# Patient Record
Sex: Female | Born: 1940 | Race: White | Hispanic: No | Smoking: Never smoker
Health system: Southern US, Community
[De-identification: ages and names within clinical notes are randomized; demographics above are authoritative.]

## PROBLEM LIST (undated history)

## (undated) DIAGNOSIS — D219 Benign neoplasm of connective and other soft tissue, unspecified: Secondary | ICD-10-CM

## (undated) DIAGNOSIS — H9190 Unspecified hearing loss, unspecified ear: Secondary | ICD-10-CM

## (undated) DIAGNOSIS — G43909 Migraine, unspecified, not intractable, without status migrainosus: Secondary | ICD-10-CM

## (undated) DIAGNOSIS — T8859XA Other complications of anesthesia, initial encounter: Secondary | ICD-10-CM

## (undated) DIAGNOSIS — R413 Other amnesia: Secondary | ICD-10-CM

## (undated) DIAGNOSIS — M1610 Unilateral primary osteoarthritis, unspecified hip: Secondary | ICD-10-CM

## (undated) DIAGNOSIS — M419 Scoliosis, unspecified: Secondary | ICD-10-CM

## (undated) DIAGNOSIS — N809 Endometriosis, unspecified: Secondary | ICD-10-CM

## (undated) DIAGNOSIS — E7800 Pure hypercholesterolemia, unspecified: Secondary | ICD-10-CM

## (undated) DIAGNOSIS — E079 Disorder of thyroid, unspecified: Secondary | ICD-10-CM

## (undated) HISTORY — DX: Migraine, unspecified, not intractable, without status migrainosus: G43.909

## (undated) HISTORY — DX: Unilateral primary osteoarthritis, unspecified hip: M16.10

## (undated) HISTORY — DX: Benign neoplasm of connective and other soft tissue, unspecified: D21.9

## (undated) HISTORY — DX: Pure hypercholesterolemia, unspecified: E78.00

## (undated) HISTORY — DX: Other amnesia: R41.3

## (undated) HISTORY — DX: Scoliosis, unspecified: M41.9

## (undated) HISTORY — DX: Endometriosis, unspecified: N80.9

## (undated) HISTORY — DX: Unspecified hearing loss, unspecified ear: H91.90

## (undated) HISTORY — DX: Disorder of thyroid, unspecified: E07.9

---

## 1965-01-19 HISTORY — PX: OOPHORECTOMY: SHX86

## 1979-01-20 HISTORY — PX: TOTAL ABDOMINAL HYSTERECTOMY: SHX209

## 1997-08-02 ENCOUNTER — Ambulatory Visit (HOSPITAL_COMMUNITY): Admission: RE | Admit: 1997-08-02 | Discharge: 1997-08-02 | Payer: Self-pay | Admitting: *Deleted

## 1998-12-04 ENCOUNTER — Other Ambulatory Visit: Admission: RE | Admit: 1998-12-04 | Discharge: 1998-12-04 | Payer: Self-pay | Admitting: Obstetrics and Gynecology

## 1999-01-20 DIAGNOSIS — R413 Other amnesia: Secondary | ICD-10-CM

## 1999-01-20 HISTORY — DX: Other amnesia: R41.3

## 1999-04-30 ENCOUNTER — Encounter: Admission: RE | Admit: 1999-04-30 | Discharge: 1999-04-30 | Payer: Self-pay | Admitting: Cardiology

## 1999-04-30 ENCOUNTER — Encounter: Payer: Self-pay | Admitting: Cardiology

## 2000-05-07 ENCOUNTER — Encounter: Admission: RE | Admit: 2000-05-07 | Discharge: 2000-05-07 | Payer: Self-pay | Admitting: Cardiology

## 2000-05-07 ENCOUNTER — Encounter: Payer: Self-pay | Admitting: Cardiology

## 2001-01-04 ENCOUNTER — Ambulatory Visit (HOSPITAL_COMMUNITY): Admission: RE | Admit: 2001-01-04 | Discharge: 2001-01-04 | Payer: Self-pay | Admitting: *Deleted

## 2001-03-11 ENCOUNTER — Encounter: Payer: Self-pay | Admitting: Obstetrics and Gynecology

## 2001-03-11 ENCOUNTER — Encounter: Admission: RE | Admit: 2001-03-11 | Discharge: 2001-03-11 | Payer: Self-pay | Admitting: Family Medicine

## 2001-05-12 ENCOUNTER — Encounter: Payer: Self-pay | Admitting: Obstetrics and Gynecology

## 2001-05-12 ENCOUNTER — Encounter: Admission: RE | Admit: 2001-05-12 | Discharge: 2001-05-12 | Payer: Self-pay | Admitting: Obstetrics and Gynecology

## 2002-08-18 ENCOUNTER — Ambulatory Visit (HOSPITAL_COMMUNITY): Admission: RE | Admit: 2002-08-18 | Discharge: 2002-08-18 | Payer: Self-pay | Admitting: Cardiology

## 2002-08-18 ENCOUNTER — Encounter: Payer: Self-pay | Admitting: Cardiology

## 2003-01-01 ENCOUNTER — Ambulatory Visit (HOSPITAL_COMMUNITY): Admission: RE | Admit: 2003-01-01 | Discharge: 2003-01-01 | Payer: Self-pay | Admitting: Surgery

## 2003-01-01 ENCOUNTER — Encounter (INDEPENDENT_AMBULATORY_CARE_PROVIDER_SITE_OTHER): Payer: Self-pay | Admitting: Specialist

## 2003-03-17 ENCOUNTER — Encounter: Admission: RE | Admit: 2003-03-17 | Discharge: 2003-03-17 | Payer: Self-pay | Admitting: Internal Medicine

## 2003-04-06 ENCOUNTER — Encounter: Admission: RE | Admit: 2003-04-06 | Discharge: 2003-04-06 | Payer: Self-pay | Admitting: Cardiology

## 2003-06-25 ENCOUNTER — Ambulatory Visit (HOSPITAL_COMMUNITY): Admission: RE | Admit: 2003-06-25 | Discharge: 2003-06-25 | Payer: Self-pay | Admitting: Surgery

## 2004-04-10 ENCOUNTER — Encounter: Admission: RE | Admit: 2004-04-10 | Discharge: 2004-04-10 | Payer: Self-pay | Admitting: Cardiology

## 2004-05-29 ENCOUNTER — Encounter: Admission: RE | Admit: 2004-05-29 | Discharge: 2004-05-29 | Payer: Self-pay | Admitting: Obstetrics and Gynecology

## 2004-09-05 ENCOUNTER — Ambulatory Visit (HOSPITAL_COMMUNITY): Admission: RE | Admit: 2004-09-05 | Discharge: 2004-09-05 | Payer: Self-pay | Admitting: Cardiology

## 2005-04-23 ENCOUNTER — Encounter: Admission: RE | Admit: 2005-04-23 | Discharge: 2005-04-23 | Payer: Self-pay | Admitting: Obstetrics and Gynecology

## 2006-04-06 ENCOUNTER — Ambulatory Visit (HOSPITAL_COMMUNITY): Admission: RE | Admit: 2006-04-06 | Discharge: 2006-04-06 | Payer: Self-pay | Admitting: *Deleted

## 2006-05-31 ENCOUNTER — Encounter: Admission: RE | Admit: 2006-05-31 | Discharge: 2006-05-31 | Payer: Self-pay | Admitting: Obstetrics and Gynecology

## 2006-06-02 ENCOUNTER — Encounter: Admission: RE | Admit: 2006-06-02 | Discharge: 2006-06-02 | Payer: Self-pay | Admitting: Obstetrics and Gynecology

## 2007-06-08 ENCOUNTER — Ambulatory Visit (HOSPITAL_COMMUNITY): Admission: RE | Admit: 2007-06-08 | Discharge: 2007-06-08 | Payer: Self-pay | Admitting: *Deleted

## 2007-06-14 ENCOUNTER — Encounter: Admission: RE | Admit: 2007-06-14 | Discharge: 2007-06-14 | Payer: Self-pay | Admitting: Cardiology

## 2007-06-17 ENCOUNTER — Encounter: Admission: RE | Admit: 2007-06-17 | Discharge: 2007-06-17 | Payer: Self-pay | Admitting: Cardiology

## 2008-06-19 ENCOUNTER — Encounter: Admission: RE | Admit: 2008-06-19 | Discharge: 2008-06-19 | Payer: Self-pay | Admitting: Obstetrics and Gynecology

## 2009-07-11 ENCOUNTER — Encounter: Admission: RE | Admit: 2009-07-11 | Discharge: 2009-07-11 | Payer: Self-pay | Admitting: Anesthesiology

## 2010-02-09 ENCOUNTER — Encounter: Payer: Self-pay | Admitting: Cardiology

## 2010-06-03 NOTE — Op Note (Signed)
[name], Cheryl Bullock NO.:  [id number]   MEDICAL RECORD NO.:  [id number]          PATIENT TYPE:  AMB   LOCATION:  ENDO                         FACILITY:  [facility]   PHYSICIAN:  Georgiana Spinner, M.D.    DATE OF BIRTH:  [birth year]   DATE OF PROCEDURE:  06/08/2007  DATE OF DISCHARGE:                               OPERATIVE REPORT   PROCEDURE:  Colonoscopy.   INDICATIONS:  Abdominal pain.   ANESTHESIA:  Fentanyl 100 mcg, Versed 10 mg.   PROCEDURE:  With the patient mildly sedated in the left lateral  decubitus position, the Pentax videoscopic pediatric colonoscope was  inserted in the rectum, passed under direct vision through a very  tortuous colon in the sigmoid area. It took [place] quite some time to do  this but we were able to pass through this and get to what we felt was  possibly the splenic flexure and at that point further pressure on the  scope caused discomfort and my intention with this was to just get to  this level so I could review the sigmoid colon, so from this point the  colonoscope was slowly withdrawn taking circumferential views of the  colonic mucosa, looking very well at the sigmoid colon where no  abnormalities were noted but there were probably adhesions because there  was a tight turn that was difficult to traverse but we were able to  subsequently do this and withdrew the colonoscope taking circumferential  views of the remaining colonic mucosa stopping in the rectum, which  appeared normal on direct and showed hemorrhoids on retroflexed view.  The endoscope was straightened and withdrawn.  The patient's vital signs  and pulse oximetry remained stable.  The patient tolerated the procedure  well without apparent complications.   FINDINGS:  1. Tortuous sigmoid colon.  2. Presumably adhesions.  3. Otherwise an unremarkable examination but limited to pretty much      this area.           ______________________________  Georgiana Spinner,  M.D.     GMO/MEDQ  D:  06/08/2007  T:  06/08/2007  Job:  [id number]

## 2010-06-06 NOTE — Op Note (Signed)
Cheryl Bullock, YINGST NO.:  [id number]   MEDICAL RECORD NO.:  [id number]          PATIENT TYPE:  AMB   LOCATION:  ENDO                         FACILITY:  [facility]   PHYSICIAN:  Georgiana Spinner, M.D.    DATE OF BIRTH:  Sep 19, 1940   DATE OF PROCEDURE:  DATE OF DISCHARGE:                               OPERATIVE REPORT   ANESTHESIA:  Demerol 60 mg, Versed 5 mg.   PROCEDURE:  With the patient mildly sedated in the left lateral  decubitus position a Pentax videoscopic endoscope was inserted in the  mouth and passed under direct vision through the esophagus, which  appeared normal to the stomach.  The fundus, body, antrum, duodenal  bulb, second portion was visualized.  From this point, the endoscope was  slowly withdrawn taking circumferential views of the duodenal mucosa  until the endoscope had been pulled back into the stomach, placed in  retroflexion and viewed the stomach from below.  The endoscope was then  straightened and withdrawn, taking circumferential views of the  remaining gastric and esophageal mucosa.  The patient's vital signs and  pulses continued to remain stable.  The patient tolerated the procedure  well.  No apparent complications.           ______________________________  Georgiana Spinner, M.D.     GMO/MEDQ  D:  04/06/2006  T:  04/06/2006  Job:  [id number]

## 2010-06-06 NOTE — Op Note (Signed)
NAMEJAHNAY, LANTIER NO.:  [id number]   MEDICAL RECORD NO.:  [id number]          PATIENT TYPE:  AMB   LOCATION:  ENDO                         FACILITY:  [facility]   PHYSICIAN:  Georgiana Spinner, M.D.    DATE OF BIRTH:  Jan 07, 1941   DATE OF PROCEDURE:  04/06/2006  DATE OF DISCHARGE:  04/06/2006                               OPERATIVE REPORT   PROCEDURE:  Colonoscopy.   INDICATIONS:  Colon cancer screening, colon polyps.   ANESTHESIA:  Demerol 20 mg, Versed 3 mg.   DESCRIPTION OF PROCEDURE:  With the patient mildly sedated in the left  lateral decubitus position, the Pentax videoscopic colonoscope was  inserted in the rectum, passed under direct vision to the sigmoid colon,  and could be advanced no further due to an acute turn in the colon.  Therefore, the colonoscope was withdrawn.  Subsequently, the Pentax  videoscopic pediatric colonoscope was inserted in the rectum and then  passed under direct vision to the cecum identified by the ileocecal  valve and appendiceal orifice, both of which were photographed. From  this point, the colonoscope was slowly withdrawn taking circumferential  views of the colonic mucosa stopping in the rectum which appeared normal  on direct and showed hemorrhoids on retroflexed view. The endoscope was  straightened and withdrawn.  The patient's vital signs and pulse  oximeter remained stable.  The patient tolerated the procedure well  without apparent complication.   FINDINGS:  Tortuosity of sigmoid colon.  Internal hemorrhoids.  Otherwise, an unremarkable examination.   PLAN:  Repeat examination possibly in one year to view the area that  could not be well seen due to the tortuosity of the colon and this was  explained to the patient at this time.           ______________________________  Georgiana Spinner, M.D.     GMO/MEDQ  D:  04/08/2006  T:  04/08/2006  Job:  [id number]

## 2010-06-06 NOTE — Procedures (Signed)
[facility]  Patient:    Cheryl Bullock, Cheryl Bullock Visit Number: [id number] MRN: [id number]          Service Type: END Location: ENDO Attending Physician:  Sabino Gasser Dictated by:   Sabino Gasser, M.D. Admit Date:  01/04/2001                             Procedure Report  PROCEDURE:  Colonoscopy.  INDICATION:  Colon polyp.  ANESTHESIA:  Demerol 50 mg, Versed 7.5 mg.  DESCRIPTION OF PROCEDURE:  With patient mildly sedated in the left lateral decubitus position, the Olympus videoscopic colonoscope was inserted in the rectum, passed under direct vision to the cecum, identified by ileocecal valve and appendiceal orifice, both of which were photographed.  From this point the colonoscope was slowly withdrawn, taking circumferential views of the entire colonic mucosa, stopping only in the rectum, which appeared normal on direct and showed hemorrhoids on retroflex view.  The endoscope was straightened and withdrawn.  The patients vital signs and pulse oximetry remained stable.  The patient tolerated the procedure well without apparent complications.  FINDINGS:  Unremarkable colonoscopic examination other than hemorrhoids.  PLAN:  Repeat examination in five years. Dictated by:   Sabino Gasser, M.D. Attending Physician:  Sabino Gasser DD:  01/04/01 TD:  01/04/01 Job: [id number]

## 2010-06-06 NOTE — Op Note (Signed)
[name], Cheryl Bullock NO.:  [id number]   MEDICAL RECORD NO.:  [id number]          PATIENT TYPE:  AMB   LOCATION:  ENDO                         FACILITY:  [facility]   PHYSICIAN:  Georgiana Spinner, M.D.    DATE OF BIRTH:  [birth year]   DATE OF PROCEDURE:  DATE OF DISCHARGE:                               OPERATIVE REPORT   PROCEDURE:  __________   ANESTHESIA:  __________   PROCEDURE:  With the patient mildly sedated in the left lateral  decubitus position,__________ was inserted but could not be entered  further.  Therefore, __________ photographed.  From this point, the  colonoscope was slowly withdrawn __________ .  The patient tolerated the  procedure well. __________           ______________________________  Georgiana Spinner, M.D.     GMO/MEDQ  D:  04/06/2006  T:  04/06/2006  Job:  [id number]

## 2010-06-11 ENCOUNTER — Other Ambulatory Visit: Payer: Self-pay | Admitting: Internal Medicine

## 2010-06-11 DIAGNOSIS — Z1231 Encounter for screening mammogram for malignant neoplasm of breast: Secondary | ICD-10-CM

## 2010-07-14 ENCOUNTER — Ambulatory Visit
Admission: RE | Admit: 2010-07-14 | Discharge: 2010-07-14 | Disposition: A | Discharge status: Home / Self Care | Payer: Medicare Other | Source: Ambulatory Visit | Attending: Internal Medicine | Admitting: Internal Medicine

## 2010-07-14 DIAGNOSIS — Z1231 Encounter for screening mammogram for malignant neoplasm of breast: Secondary | ICD-10-CM

## 2011-06-09 ENCOUNTER — Other Ambulatory Visit: Payer: Self-pay | Admitting: Obstetrics and Gynecology

## 2011-06-09 DIAGNOSIS — Z1231 Encounter for screening mammogram for malignant neoplasm of breast: Secondary | ICD-10-CM

## 2011-07-16 ENCOUNTER — Ambulatory Visit
Admission: RE | Admit: 2011-07-16 | Discharge: 2011-07-16 | Disposition: A | Discharge status: Home / Self Care | Payer: Medicare Other | Source: Ambulatory Visit | Attending: Obstetrics and Gynecology | Admitting: Obstetrics and Gynecology

## 2011-07-16 DIAGNOSIS — Z1231 Encounter for screening mammogram for malignant neoplasm of breast: Secondary | ICD-10-CM

## 2012-06-06 ENCOUNTER — Other Ambulatory Visit: Payer: Self-pay

## 2012-06-06 DIAGNOSIS — Z1231 Encounter for screening mammogram for malignant neoplasm of breast: Secondary | ICD-10-CM

## 2012-07-18 ENCOUNTER — Ambulatory Visit
Admission: RE | Admit: 2012-07-18 | Discharge: 2012-07-18 | Disposition: A | Discharge status: Home / Self Care | Payer: Medicare Other | Source: Ambulatory Visit

## 2012-07-18 DIAGNOSIS — Z1231 Encounter for screening mammogram for malignant neoplasm of breast: Secondary | ICD-10-CM

## 2012-08-03 ENCOUNTER — Encounter: Payer: Self-pay | Admitting: Obstetrics and Gynecology

## 2012-08-09 ENCOUNTER — Encounter: Payer: Self-pay | Admitting: Obstetrics and Gynecology

## 2012-08-09 ENCOUNTER — Ambulatory Visit (INDEPENDENT_AMBULATORY_CARE_PROVIDER_SITE_OTHER): Payer: Medicare Other | Admitting: Obstetrics and Gynecology

## 2012-08-09 VITALS — BP 98/60 | HR 76 | Resp 16 | Ht 64.5 in | Wt 133.0 lb

## 2012-08-09 DIAGNOSIS — Z01419 Encounter for gynecological examination (general) (routine) without abnormal findings: Secondary | ICD-10-CM

## 2012-08-09 MED ORDER — ESTROPIPATE 0.75 MG PO TABS: 0.7500 mg | ORAL_TABLET | Freq: Every day | ORAL | Status: DC

## 2012-08-09 NOTE — Progress Notes (Signed)
72 y.o.   Married    Caucasian   female   No obstetric history on file.   here for annual exam.  Still wants to take her ERT   No LMP recorded. Patient has had a hysterectomy.          Sexually active: yes  The current method of family planning is status post hysterectomy and post menopausal status.    Exercising: [employer] 4-5 days a week, weights, cardio, stretching Last mammogram:  07/2012 normal Last pap smear:12/04/1998 neg History of abnormal pap: no Smoking: never Alcohol: 1-2 glasses of alcohol a week (vodka) Last colonoscopy:2009 normal, repeat in 10 years Last Bone Density:  04/15/09 ? osteopenia Last tetanus shot: 2005 Last cholesterol check: 2013 normal with medication  Hgb:   pcp             Urine: pcp   Family History  Problem Relation Age of Onset  . Cancer Mother     pancreatic cancer  . Osteoporosis Mother   . Dementia Father   . Cancer Maternal Aunt     pancreatic cancer    There are no active problems to display for this patient.   Past Medical History  Diagnosis Date  . Thyroid disease     hypo  . Migraines   . Arthritis, hip   . Hard of hearing     hearing aids  . Elevated cholesterol   . Endometriosis   . Fibroid   . 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 Total abdominal hysterectomy  1981    bilat oophorectomy    Allergies: Biaxin  Current Outpatient Prescriptions  Medication Sig Dispense Refill  . aspirin 81 MG tablet Take 81 mg by mouth daily.      . Calcium Carb-Cholecalciferol (CALCIUM PLUS VITAMIN D3) 600-500 MG-UNIT CAPS Take by mouth 2 (two) times daily.      . Estropipate (OGEN 0.625 PO) Take by mouth daily.      Marland Kitchen levothyroxine (SYNTHROID, LEVOTHROID) 88 MCG tablet Take 88 mcg by mouth daily before breakfast.      . omeprazole (PRILOSEC) 20 MG capsule       . ranitidine (ZANTAC) 150 MG capsule Take 150 mg by mouth daily.      . rizatriptan (MAXALT) 10 MG tablet Take 10  mg by mouth as needed for migraine. May repeat in 2 hours if needed      . simvastatin (ZOCOR) 5 MG tablet        No current facility-administered medications for this visit.    ROS: Pertinent items are noted in HPI.  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  Exam:    BP 98/60  Pulse 76  Resp 16  Ht 5' 4.5" (1.638 m)  Wt 133 lb (60.328 kg)  BMI 22.48 kg/m2  Ht and wt stable from last year Wt Readings from Last 3 Encounters:  08/09/12 133 lb (60.328 kg)     Ht Readings from Last 3 Encounters:  08/09/12 5' 4.5" (1.638 m)    General appearance: alert, cooperative and appears stated age Head: Normocephalic, without obvious abnormality, atraumatic Neck: no adenopathy, supple, symmetrical, trachea midline and thyroid not enlarged, symmetric, no tenderness/mass/nodules Lungs: clear to auscultation bilaterally Breasts: Inspection negative, No nipple retraction or dimpling, No nipple discharge or bleeding, No axillary or supraclavicular adenopathy, Normal  to palpation without dominant masses Heart: regular rate and rhythm Abdomen: soft, non-tender; bowel sounds normal; no masses,  no organomegaly Extremities: extremities normal, atraumatic, no cyanosis or edema Skin: Skin color, texture, turgor normal. No rashes or lesions Lymph nodes: Cervical, supraclavicular, and axillary nodes normal. No abnormal inguinal nodes palpated Neurologic: Grossly normal   Pelvic: External genitalia:  no lesions              Urethra:  normal appearing urethra with no masses, tenderness or lesions              Bartholins and Skenes: normal                 Vagina: normal appearing vagina with normal color and discharge, no lesions              Cervix: absent              Pap taken: no        Bimanual Exam:  Uterus:  absent                                      Adnexa: absent, nontender and no masses                                       Rectovaginal: Confirms                                      Anus:  normal sphincter tone, no lesions  A: normal menopausal exam, on ERT     S/p TAH/BSO fibroids     Hard of hearing - wears hearing aids     P: mammogram counseled on breast self exam, mammography screening, adequate intake of calcium and vitamin D, diet and exercise return annually or prn     An After Visit Summary was printed and given to the patient.

## 2012-08-09 NOTE — Patient Instructions (Signed)

## 2013-05-06 ENCOUNTER — Other Ambulatory Visit: Payer: Self-pay | Admitting: Nurse Practitioner

## 2013-06-16 ENCOUNTER — Other Ambulatory Visit: Payer: Self-pay

## 2013-06-16 DIAGNOSIS — Z1231 Encounter for screening mammogram for malignant neoplasm of breast: Secondary | ICD-10-CM

## 2013-07-19 ENCOUNTER — Encounter (INDEPENDENT_AMBULATORY_CARE_PROVIDER_SITE_OTHER): Payer: Self-pay

## 2013-07-19 ENCOUNTER — Ambulatory Visit
Admission: RE | Admit: 2013-07-19 | Discharge: 2013-07-19 | Disposition: A | Discharge status: Home / Self Care | Payer: Medicare Other | Source: Ambulatory Visit

## 2013-07-19 DIAGNOSIS — Z1231 Encounter for screening mammogram for malignant neoplasm of breast: Secondary | ICD-10-CM

## 2013-08-10 ENCOUNTER — Ambulatory Visit: Payer: Medicare Other | Admitting: Obstetrics and Gynecology

## 2013-08-16 ENCOUNTER — Encounter: Payer: Self-pay | Admitting: Obstetrics and Gynecology

## 2013-08-16 ENCOUNTER — Ambulatory Visit (INDEPENDENT_AMBULATORY_CARE_PROVIDER_SITE_OTHER): Payer: Medicare Other | Admitting: Obstetrics and Gynecology

## 2013-08-16 VITALS — BP 108/70 | HR 68 | Ht 64.5 in | Wt 125.0 lb

## 2013-08-16 DIAGNOSIS — Z01419 Encounter for gynecological examination (general) (routine) without abnormal findings: Secondary | ICD-10-CM

## 2013-08-16 MED ORDER — ESTROPIPATE 0.75 MG PO TABS: 0.7500 mg | ORAL_TABLET | Freq: Every day | ORAL | Status: DC

## 2013-08-16 NOTE — Progress Notes (Signed)
GYNECOLOGY VISIT  PCP: Dr. Thressa Sheller  Referring provider:   HPI: 73 y.o.   Married  Caucasian  female   No obstetric history on file. with No LMP recorded. Patient has had a hysterectomy.   Had USO at age 20 and then had the other removed at time of hysterectomy at age 15 years old.  here for  Annual  Currently on Estropipate for menopausal symptoms.  Has not previously tried to come off.   Takes baby ASA daily for event of amnesia that lasted one hour.   Mother and sister with hx pancreatic cancer.  In a study for familial pancreatic cancer.  Not taking any Rx as part of the trial.   States cold intolerance and has normal thyroid levels on Synthroid.   Decreased libido.  Denies pain with intercourse.   Hgb: PCP 02/15  Urine: pcp   GYNECOLOGIC HISTORY: No LMP recorded. Patient has had a hysterectomy. Sexually active:  yes Partner preference: female Contraception:   none Menopausal hormone therapy: estropipate 1 tab qd DES exposure:   no Blood transfusions: no   Sexually transmitted diseases:   no GYN procedures and prior surgeries:  Total hysterectomy, 1981 Last mammogram: 07/20/13 BI-RADS1 Neg                Last pap and high risk HPV testing:12/04/1998 neg    History of abnormal pap smear:  no   OB History   Grav Para Term Preterm Abortions TAB SAB Ect Mult Living                   LIFESTYLE: Exercise:   Elliptical and weight bearing exercises            Tobacco: no Alcohol: 1 glass a week Drug use:no    OTHER HEALTH MAINTENANCE: Tetanus/TDap: 2005 will get with pcp Gardisil:no Influenza: 09/2012 with pcp  Zostavax: pcp  Bone density:2011, wnl Colonoscopy:2009, normal repeat in 10 years  Cholesterol check: pcp  Family History  Problem Relation Age of Onset  . Cancer Mother     pancreatic cancer  . Osteoporosis Mother   . Dementia Father   . Cancer Maternal Aunt     pancreatic cancer    There are no active problems to display for this  patient.  Past Medical History  Diagnosis Date  . Thyroid disease     hypo  . Migraines   . Arthritis, hip   . Hard of hearing     hearing aids  . Elevated cholesterol   . Endometriosis   . Fibroid   . 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 Total abdominal hysterectomy  1981    bilat oophorectomy    ALLERGIES: Biaxin  Current Outpatient Prescriptions  Medication Sig Dispense Refill  . acetaminophen (TYLENOL) 500 MG tablet Take 1,000 mg by mouth 2 (two) times daily.      Marland Kitchen aspirin 81 MG tablet Take 81 mg by mouth daily.      . Calcium Carb-Cholecalciferol (CALCIUM PLUS VITAMIN D3) 600-500 MG-UNIT CAPS Take by mouth 2 (two) times daily.      Marland Kitchen estropipate (OGEN) 0.75 MG tablet TAKE 1 TABLET BY MOUTH EVERY DAY  90 tablet  0  . levothyroxine (SYNTHROID, LEVOTHROID) 88 MCG tablet Take 88 mcg by mouth daily before breakfast.      . omeprazole (PRILOSEC) 20 MG capsule       . ranitidine (  ZANTAC) 150 MG capsule Take 150 mg by mouth daily.      . rizatriptan (MAXALT) 10 MG tablet Take 10 mg by mouth as needed for migraine. May repeat in 2 hours if needed      . simvastatin (ZOCOR) 5 MG tablet        No current facility-administered medications for this visit.     ROS:  Pertinent items are noted in HPI.  SOCIAL HISTORY:  REtired.   PHYSICAL EXAMINATION:    BP 108/70  Pulse 68  Ht 5' 4.5" (1.638 m)  Wt 125 lb (56.7 kg)  BMI 21.13 kg/m2   Wt Readings from Last 3 Encounters:  08/16/13 125 lb (56.7 kg)  08/09/12 133 lb (60.328 kg)     Ht Readings from Last 3 Encounters:  08/16/13 5' 4.5" (1.638 m)  08/09/12 5' 4.5" (1.638 m)    General appearance: alert, cooperative and appears stated age Head: Normocephalic, without obvious abnormality, atraumatic Neck: no adenopathy, supple, symmetrical, trachea midline and thyroid not enlarged, symmetric, no tenderness/mass/nodules Lungs: clear to auscultation bilaterally Breasts:  Inspection negative, No nipple retraction or dimpling, No nipple discharge or bleeding, No axillary or supraclavicular adenopathy, Normal to palpation without dominant masses Heart: regular rate and rhythm Abdomen: soft, non-tender; no masses,  no organomegaly Extremities: extremities normal, atraumatic, no cyanosis or edema Skin: Skin color, texture, turgor normal. No rashes or lesions Lymph nodes: Cervical, supraclavicular, and axillary nodes normal. No abnormal inguinal nodes palpated Neurologic: Grossly normal  Pelvic: External genitalia:  no lesions              Urethra:  normal appearing urethra with no masses, tenderness or lesions              Bartholins and Skenes: normal                 Vagina: normal appearing vagina with normal color and discharge, no lesions              Cervix:  absent              Pap and high risk HPV testing done: No..            Bimanual Exam:  Uterus:  absent                                      Adnexa: normal adnexa in size, nontender and no masses                                      Rectovaginal: Confirms                                      Anus:  normal sphincter tone, no lesions  ASSESSMENT  Normal gynecologic exam. ERT patient.  Decreased libido.   PLAN  Mammogram recommended yearly.  Pap smear and high risk HPV testing not indicated.  Refill of Estropipate.  See Epic.  Discussed benefits and risks including breast cancer, DVT, PE, MI, stroke.  Patient accepts this and wishes to continue.  Counseled on self breast exam, Calcium and vitamin D intake, exercise. Discussed libido issues.  Patient declines Estratest.  Return annually or prn   An After Visit Summary  was printed and given to the patient.

## 2013-08-16 NOTE — Patient Instructions (Signed)

## 2013-12-06 ENCOUNTER — Telehealth: Payer: Self-pay | Admitting: Obstetrics and Gynecology

## 2013-12-06 NOTE — Telephone Encounter (Signed)
Left message regarding upcoming appointment with Dr.Silva needs to be rescheduled.

## 2014-01-09 ENCOUNTER — Telehealth: Payer: Self-pay

## 2014-01-09 NOTE — Telephone Encounter (Signed)
Faxed new prescription form for Estropipate tablet 0.75mg  to [facility] at [phone] with cover sheet and fax confirmation. Form filled out and signed by provider.  Routing to provider for final review. Patient agreeable to disposition. Will close encounter

## 2014-01-29 ENCOUNTER — Telehealth: Payer: Self-pay | Admitting: Obstetrics and Gynecology

## 2014-01-29 NOTE — Telephone Encounter (Signed)
Pt would like to speak with nurse regarding her hormone medication Estraopipate. Says her insurance is no longer covering.

## 2014-01-29 NOTE — Telephone Encounter (Signed)
Spoke with patient.  Has prior authorization on file through 02/16/14 with [employer].  Patient would like to continue rx if Dr. Quincy Simmonds agreeable. Has year supply through 07/2013.  Advised can complete prior authorization, if not approved, will have to pay cash for rx.  Patient agreeable.   Requested prior authorization from [facility]. Completed and to Dr Quincy Simmonds for signature.

## 2014-02-02 NOTE — Telephone Encounter (Signed)
Prior authorization faxed with fax confirmation received at this time.

## 2014-02-08 NOTE — Telephone Encounter (Signed)
Prior authorization approved through 01/19/2015. Detailed message left, okay per designated party release form.   Routing to provider for final review. Patient agreeable to disposition. Will close encounter

## 2014-05-10 DIAGNOSIS — K219 Gastro-esophageal reflux disease without esophagitis: Secondary | ICD-10-CM | POA: Diagnosis present

## 2014-05-10 DIAGNOSIS — E039 Hypothyroidism, unspecified: Secondary | ICD-10-CM | POA: Diagnosis present

## 2014-05-10 DIAGNOSIS — E785 Hyperlipidemia, unspecified: Secondary | ICD-10-CM | POA: Diagnosis present

## 2014-05-10 DIAGNOSIS — Z8669 Personal history of other diseases of the nervous system and sense organs: Secondary | ICD-10-CM

## 2014-06-11 ENCOUNTER — Other Ambulatory Visit: Payer: Self-pay

## 2014-06-11 DIAGNOSIS — Z1231 Encounter for screening mammogram for malignant neoplasm of breast: Secondary | ICD-10-CM

## 2014-07-24 ENCOUNTER — Ambulatory Visit
Admission: RE | Admit: 2014-07-24 | Discharge: 2014-07-24 | Disposition: A | Discharge status: Home / Self Care | Payer: Medicare Other | Source: Ambulatory Visit

## 2014-07-24 DIAGNOSIS — Z1231 Encounter for screening mammogram for malignant neoplasm of breast: Secondary | ICD-10-CM

## 2014-08-07 ENCOUNTER — Telehealth: Payer: Self-pay | Admitting: Obstetrics and Gynecology

## 2014-08-07 MED ORDER — ESTROPIPATE 0.75 MG PO TABS: 0.7500 mg | ORAL_TABLET | Freq: Every day | ORAL | Status: DC

## 2014-08-07 NOTE — Telephone Encounter (Signed)
Left message to call Kaitlyn at [phone]. 

## 2014-08-07 NOTE — Telephone Encounter (Signed)
Spoke with patient. Patient states that she needs refill for Estropipate 0.75mg  tablet daily. Patient would like for this to be sent to [facility] Last aex was 08/16/2013. Next aex scheduled for 11/02/2014 with Dr.Silva. Patient had mammogram performed on 07/24/2014.   Dr.Miller, okay to send in refills for patient until next aex?

## 2014-08-07 NOTE — Telephone Encounter (Signed)
Yes. That is fine.  

## 2014-08-07 NOTE — Telephone Encounter (Signed)
Rx for Estropipate 0.75 mg tablet daily #90 0RF sent to [facility] off [address].  Routing to provider for final review. Patient agreeable to disposition. Will close encounter.

## 2014-08-07 NOTE — Telephone Encounter (Signed)
Patient wants to discuss an issue with the nurse. No information given.

## 2014-08-20 ENCOUNTER — Ambulatory Visit: Payer: Medicare Other | Admitting: Obstetrics and Gynecology

## 2014-08-22 ENCOUNTER — Ambulatory Visit: Payer: Medicare Other | Admitting: Obstetrics and Gynecology

## 2014-11-02 ENCOUNTER — Encounter: Payer: Self-pay | Admitting: Obstetrics and Gynecology

## 2014-11-02 ENCOUNTER — Ambulatory Visit (INDEPENDENT_AMBULATORY_CARE_PROVIDER_SITE_OTHER): Payer: Medicare Other | Admitting: Obstetrics and Gynecology

## 2014-11-02 VITALS — BP 110/76 | HR 60 | Resp 16 | Ht 64.75 in | Wt 126.0 lb

## 2014-11-02 DIAGNOSIS — Z79899 Other long term (current) drug therapy: Secondary | ICD-10-CM | POA: Diagnosis not present

## 2014-11-02 DIAGNOSIS — Z01419 Encounter for gynecological examination (general) (routine) without abnormal findings: Secondary | ICD-10-CM | POA: Diagnosis not present

## 2014-11-02 DIAGNOSIS — R350 Frequency of micturition: Secondary | ICD-10-CM

## 2014-11-02 DIAGNOSIS — M8580 Other specified disorders of bone density and structure, unspecified site: Secondary | ICD-10-CM | POA: Diagnosis not present

## 2014-11-02 MED ORDER — ESTROPIPATE 0.75 MG PO TABS: 0.7500 mg | ORAL_TABLET | Freq: Every day | ORAL | Status: DC

## 2014-11-02 NOTE — Progress Notes (Signed)
Patient ID: Cheryl Bullock, female   DOB: 05/13/1940, 74 y.o.   MRN: [id number] 74 y.o. [place] Married Caucasian female here for annual exam.   Would like to continue estrogen.  Very concerned about her bone health and fracture risk reduction.  She knows several people who have died following fractures.   Has urinary frequency during day and night.  No incontinence.  This is long standing.  One caffeine in am.  Rare ETOH.  Saw PCP and had a good evaluation.   PCP:   Trilby Drummer, MD  No LMP recorded. Patient has had a hysterectomy.          Sexually active: Yes.   female The current method of family planning is status post hysterectomy.    Exercising: Yes.    walking, cardio and weights 3-4 days per week. Smoker:  no  Health Maintenance: Pap:  12-04-1998 Negative History of abnormal Pap:  no MMG:  07-24-14 Density Cat.C/Neg/BiRads 1:[facility]. Colonoscopy:  2009 normal with Dr. Benson Norway and due to age no further testing. BMD:   2014/2015 Result  Osteopenia:[facility].  Told it was mild and does not need treating at this time.  TDaP:  PCP Screening Labs:  Hb today: PCP, Urine today: PCP   reports that she has never smoked. She has never used smokeless tobacco. She reports that she drinks about 0.6 - 1.2 oz of alcohol per week. She reports that she does not use illicit drugs.  Past Medical History  Diagnosis Date  . Thyroid disease     hypo  . Migraines   . Arthritis, hip   . Hard of hearing     hearing aids  . Elevated cholesterol   . Endometriosis   . Fibroid   . 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 Total abdominal hysterectomy  1981    bilat oophorectomy    Current Outpatient Prescriptions  Medication Sig Dispense Refill  . acetaminophen (TYLENOL) 500 MG tablet Take 1,000 mg by mouth 2 (two) times daily.    Marland Kitchen aspirin 81 MG tablet Take 81 mg by mouth daily.    . Calcium Carb-Cholecalciferol (CALCIUM  PLUS VITAMIN D3) 600-500 MG-UNIT CAPS Take by mouth 2 (two) times daily.    Marland Kitchen estropipate (OGEN) 0.75 MG tablet Take 1 tablet (0.75 mg total) by mouth daily. 90 tablet 0  . levothyroxine (SYNTHROID, LEVOTHROID) 88 MCG tablet Take 1 tablet by mouth daily.    . ranitidine (ZANTAC) 150 MG capsule Take 150 mg by mouth daily.    . rizatriptan (MAXALT) 10 MG tablet Take 10 mg by mouth as needed for migraine. May repeat in 2 hours if needed    . simvastatin (ZOCOR) 5 MG tablet     . Cholecalciferol (VITAMIN D3) 2000 UNITS capsule Take 1 capsule by mouth daily.     No current facility-administered medications for this visit.    Family History  Problem Relation Age of Onset  . Cancer Mother     pancreatic cancer  . Osteoporosis Mother   . Dementia Father   . Cancer Maternal Aunt     pancreatic cancer    ROS:  Pertinent items are noted in HPI.  Otherwise, a comprehensive ROS was negative.  Exam:   BP 110/76 mmHg  Pulse 60  Resp 16  Ht 5' 4.75" (1.645 m)  Wt 126 lb (57.153 kg)  BMI 21.12 kg/m2  General appearance: alert, cooperative and appears stated age Head: Normocephalic, without obvious abnormality, atraumatic Neck: no adenopathy, supple, symmetrical, trachea midline and thyroid normal to inspection and palpation Lungs: clear to auscultation bilaterally Breasts: normal appearance, no masses or tenderness, Inspection negative, No nipple retraction or dimpling, No nipple discharge or bleeding, No axillary or supraclavicular adenopathy Heart: regular rate and rhythm Abdomen: soft, non-tender; bowel sounds normal; no masses,  no organomegaly Extremities: extremities normal, atraumatic, no cyanosis or edema Skin: Skin color, texture, turgor normal. No rashes or lesions Lymph nodes: Cervical, supraclavicular, and axillary nodes normal. No abnormal inguinal nodes palpated Neurologic: Grossly normal  Pelvic: External genitalia:  no lesions              Urethra:  normal appearing  urethra with no masses, tenderness or lesions              Bartholins and Skenes: normal                 Vagina: normal appearing vagina with normal color and discharge, no lesions              Cervix: absent                Bimanual Exam:  Uterus:  uterus absent              Adnexa: no mass, fullness, tenderness              Rectovaginal: Yes.  .  Confirms.              Anus:  normal sphincter tone, no lesions  Chaperone was present for exam.  Assessment:   Well woman visit with normal exam. Status post TAH/BSO.  Osteopenia.  Urinary frequency/nocturia.  Plan: Yearly mammogram recommended after age 35.  Recommended self breast exam.  Pap and HR HPV as above. Discussed Calcium, Vitamin D, regular exercise program including cardiovascular and weight bearing exercise. Labs performed.  No..   See orders. Refills given on medications.  Yes.  .  See orders.  Estropipate 0.75 mg.  I discussed risks of DVT, PE, MI, stroke, breast cancer.  She wishes to continue.  We discussed benefits of osteoporosis prevention.  Bone density per PCP.  Patient and I discussed timed voiding.  She declines an anticholinergic or antimuscarinic at this time.  I also discussed PT in future for bladder health if needed. Follow up annually and prn.      After visit summary provided.

## 2014-11-02 NOTE — Patient Instructions (Signed)

## 2015-06-24 ENCOUNTER — Other Ambulatory Visit: Payer: Self-pay | Admitting: Internal Medicine

## 2015-06-24 DIAGNOSIS — Z1231 Encounter for screening mammogram for malignant neoplasm of breast: Secondary | ICD-10-CM

## 2015-07-25 ENCOUNTER — Ambulatory Visit
Admission: RE | Admit: 2015-07-25 | Discharge: 2015-07-25 | Disposition: A | Discharge status: Home / Self Care | Payer: Medicare Other | Source: Ambulatory Visit | Attending: Internal Medicine | Admitting: Internal Medicine

## 2015-07-25 DIAGNOSIS — Z1231 Encounter for screening mammogram for malignant neoplasm of breast: Secondary | ICD-10-CM

## 2015-10-30 ENCOUNTER — Other Ambulatory Visit: Payer: Self-pay | Admitting: Obstetrics and Gynecology

## 2015-10-30 NOTE — Telephone Encounter (Signed)
Medication refill request: estropipate  Last AEX:  11-02-14  Next AEX: 11-14-15 Last MMG (if hormonal medication request): 07-26-15 WNL  Refill authorized: please advise

## 2015-11-14 ENCOUNTER — Encounter: Payer: Self-pay | Admitting: Obstetrics and Gynecology

## 2015-11-14 ENCOUNTER — Ambulatory Visit (INDEPENDENT_AMBULATORY_CARE_PROVIDER_SITE_OTHER): Payer: Medicare Other | Admitting: Obstetrics and Gynecology

## 2015-11-14 VITALS — BP 122/68 | HR 66 | Resp 16 | Ht 64.75 in | Wt 129.4 lb

## 2015-11-14 DIAGNOSIS — Z79899 Other long term (current) drug therapy: Secondary | ICD-10-CM

## 2015-11-14 DIAGNOSIS — Z01419 Encounter for gynecological examination (general) (routine) without abnormal findings: Secondary | ICD-10-CM | POA: Diagnosis not present

## 2015-11-14 NOTE — Progress Notes (Signed)
75 y.o. [place] Married Caucasian female here for annual exam.   No change in usual urinary frequency.  Likes to drink coffee and tea.   Patient is on estropipate.  Taking ERT since 1981. Asking for feedback on this.   Patient states she had hepatitis many years ago, but she is not sure which kind she had.  PCP:  Thressa Sheller, MD  No LMP recorded. Patient has had a hysterectomy.           Sexually active: Yes.   female The current method of family planning is status post hysterectomy.    Exercising: Yes.    Elliptical, aerobics and weights Smoker:  no  Health Maintenance: Pap:  2000 Negative History of abnormal Pap:  no MMG:  07-25-15 Density B/Neg/BiRads1:[facility] Colonoscopy: 2009 normal with Dr. Benson Norway and due to age no further testing. BMD: 2015 Result  Osteopenia with [facility]. Told it was mild and did not need treating at the time. TDaP:  PCP Gardasil:   N/A Hep C: will discuss with PCP.  Screening Labs:  Hb today: PCP, Urine today: PCP   reports that she has never smoked. She has never used smokeless tobacco. She reports that she drinks about 0.6 - 1.2 oz of alcohol per week . She reports that she does not use drugs.  Past Medical History:  Diagnosis Date  . Arthritis, hip   . Elevated cholesterol   . Endometriosis   . Fibroid   . 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 Hard of hearing    hearing aids  . Migraines   . Thyroid disease    hypo    Past Surgical History:  Procedure Laterality Date  . TOTAL ABDOMINAL HYSTERECTOMY  1981   bilat oophorectomy    Current Outpatient Prescriptions  Medication Sig Dispense Refill  . acetaminophen (TYLENOL) 500 MG tablet Take 1,000 mg by mouth 2 (two) times daily.    Marland Kitchen aspirin 81 MG tablet Take 81 mg by mouth daily.    . Calcium Carb-Cholecalciferol (CALCIUM PLUS VITAMIN D3) 600-500 MG-UNIT CAPS Take by mouth 2 (two) times daily.    . Cholecalciferol (VITAMIN D3) 2000 UNITS capsule Take 1 capsule by  mouth daily.    Marland Kitchen estropipate (OGEN) 0.75 MG tablet take 1 tablet by mouth once daily 30 tablet 0  . levothyroxine (SYNTHROID, LEVOTHROID) 88 MCG tablet Take 1 tablet by mouth daily.    . rizatriptan (MAXALT) 10 MG tablet Take 10 mg by mouth as needed for migraine. May repeat in 2 hours if needed    . simvastatin (ZOCOR) 5 MG tablet      No current facility-administered medications for this visit.     Family History  Problem Relation Age of Onset  . Cancer Mother     pancreatic cancer  . Osteoporosis Mother   . Dementia Father   . Cancer Maternal Aunt     pancreatic cancer    ROS:  Pertinent items are noted in HPI.  Otherwise, a comprehensive ROS was negative.  Exam:   BP 122/68 (BP Location: Right Arm, Patient Position: Sitting, Cuff Size: Normal)   Pulse 66   Resp 16   Ht 5' 4.75" (1.645 m)   Wt 129 lb 6.4 oz (58.7 kg)   BMI 21.70 kg/m     General appearance: alert, cooperative and appears stated age Head: Normocephalic, without obvious abnormality, atraumatic Neck: no adenopathy, supple, symmetrical, trachea midline and thyroid normal to inspection and palpation  Lungs: clear to auscultation bilaterally Breasts: normal appearance, no masses or tenderness, No nipple retraction or dimpling, No nipple discharge or bleeding, No axillary or supraclavicular adenopathy Heart: regular rate and rhythm Abdomen: soft, non-tender; no masses, no organomegaly Extremities: extremities normal, atraumatic, no cyanosis or edema Skin: Skin color, texture, turgor normal. No rashes or lesions Lymph nodes: Cervical, supraclavicular, and axillary nodes normal. No abnormal inguinal nodes palpated Neurologic: Grossly normal  Pelvic: External genitalia:  no lesions              Urethra:  normal appearing urethra with no masses, tenderness or lesions              Bartholins and Skenes: normal                 Vagina: normal appearing vagina with normal color and discharge, no lesions               Cervix:  absent              Pap taken: No. Bimanual Exam:  Uterus:   absent              Adnexa: no mass, fullness, tenderness              Rectal exam: Yes.  .  Confirms.              Anus:  normal sphincter tone, no lesions  Chaperone was present for exam.  Assessment:   Well woman visit with normal exam. Status post TAH/BSO.  Osteopenia.  Urinary frequency/nocturia. ERT patient.   Plan: Yearly mammogram recommended after age 50.  Recommended self breast exam.  Pap and HR HPV as above. Discussed Calcium, Vitamin D, regular exercise program including cardiovascular and weight bearing exercise. Discussed bladder irritants. BMD through PCP.  ERT discussed - risks of DVT, PE, and stroke discussed.  Benefits of osteoporosis prevention reviewed. Wants to wean off her current estrogen and will half the pills, so I am not refilling her estropipate. If she goes back on the estrogen, we will do a low dose transdermal estrogen.  Follow up annually and prn.       After visit summary provided.

## 2015-11-14 NOTE — Patient Instructions (Signed)

## 2016-03-25 DIAGNOSIS — M1812 Unilateral primary osteoarthritis of first carpometacarpal joint, left hand: Secondary | ICD-10-CM | POA: Insufficient documentation

## 2016-06-18 ENCOUNTER — Other Ambulatory Visit: Payer: Self-pay | Admitting: Obstetrics and Gynecology

## 2016-06-18 DIAGNOSIS — Z1231 Encounter for screening mammogram for malignant neoplasm of breast: Secondary | ICD-10-CM

## 2016-07-27 ENCOUNTER — Ambulatory Visit
Admission: RE | Admit: 2016-07-27 | Discharge: 2016-07-27 | Disposition: A | Discharge status: Home / Self Care | Payer: Medicare Other | Source: Ambulatory Visit | Attending: Obstetrics and Gynecology | Admitting: Obstetrics and Gynecology

## 2016-07-27 DIAGNOSIS — Z1231 Encounter for screening mammogram for malignant neoplasm of breast: Secondary | ICD-10-CM

## 2016-11-18 ENCOUNTER — Telehealth: Payer: Self-pay | Admitting: *Deleted

## 2016-11-18 NOTE — Progress Notes (Deleted)
76 y.o. G32P0000 Married Caucasian female here for annual exam.    Patient states she had hepatitis many years ago, but she is not sure which kind she had.  PCP:     No LMP recorded. Patient has had a hysterectomy.           Sexually active: {yes no:314532}  The current method of family planning is status post hysterectomy.    Exercising: {yes no:314532}  {types:19826} Smoker:  no  Health Maintenance: Pap: 2000 Neg History of abnormal Pap:  no MMG:  07-27-16 Density C/Neg/BiRads1:[facility] Colonoscopy:  2009 normal with Dr. Benson Norway and due to age no further testing. BMD:  2015  Result : Osteopenia with [place] it was mild and did not need treating at the time. TDaP:  PCP Gardasil:   no HIV:***?PCP Hep C:***?PCP Screening Labs:  Hb today: ***, Urine today: ***   reports that she has never smoked. She has never used smokeless tobacco. She reports that she drinks about 0.6 - 1.2 oz of alcohol per week . She reports that she does not use drugs.  Past Medical History:  Diagnosis Date  . Arthritis, hip   . Elevated cholesterol   . Endometriosis   . Fibroid   . 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 Hard of hearing    hearing aids  . Migraines   . Thyroid disease    hypo    Past Surgical History:  Procedure Laterality Date  . TOTAL ABDOMINAL HYSTERECTOMY  1981   bilat oophorectomy    Current Outpatient Prescriptions  Medication Sig Dispense Refill  . acetaminophen (TYLENOL) 500 MG tablet Take 1,000 mg by mouth 2 (two) times daily.    Marland Kitchen aspirin 81 MG tablet Take 81 mg by mouth daily.    . Calcium Carb-Cholecalciferol (CALCIUM PLUS VITAMIN D3) 600-500 MG-UNIT CAPS Take by mouth 2 (two) times daily.    . Cholecalciferol (VITAMIN D3) 2000 UNITS capsule Take 1 capsule by mouth daily.    Marland Kitchen estropipate (OGEN) 0.75 MG tablet take 1 tablet by mouth once daily 30 tablet 0  . levothyroxine (SYNTHROID, LEVOTHROID) 88 MCG tablet Take 1 tablet by mouth daily.    . rizatriptan  (MAXALT) 10 MG tablet Take 10 mg by mouth as needed for migraine. May repeat in 2 hours if needed    . simvastatin (ZOCOR) 5 MG tablet      No current facility-administered medications for this visit.     Family History  Problem Relation Age of Onset  . Cancer Mother        pancreatic cancer  . Osteoporosis Mother   . Dementia Father   . Cancer Maternal Aunt        pancreatic cancer    ROS:  Pertinent items are noted in HPI.  Otherwise, a comprehensive ROS was negative.  Exam:   There were no vitals taken for this visit.    General appearance: alert, cooperative and appears stated age Head: Normocephalic, without obvious abnormality, atraumatic Neck: no adenopathy, supple, symmetrical, trachea midline and thyroid normal to inspection and palpation Lungs: clear to auscultation bilaterally Breasts: normal appearance, no masses or tenderness, No nipple retraction or dimpling, No nipple discharge or bleeding, No axillary or supraclavicular adenopathy Heart: regular rate and rhythm Abdomen: soft, non-tender; no masses, no organomegaly Extremities: extremities normal, atraumatic, no cyanosis or edema Skin: Skin color, texture, turgor normal. No rashes or lesions Lymph nodes: Cervical, supraclavicular, and axillary nodes normal. No abnormal inguinal  nodes palpated Neurologic: Grossly normal  Pelvic: External genitalia:  no lesions              Urethra:  normal appearing urethra with no masses, tenderness or lesions              Bartholins and Skenes: normal                 Vagina: normal appearing vagina with normal color and discharge, no lesions              Cervix: no lesions              Pap taken: {yes no:314532} Bimanual Exam:  Uterus:  normal size, contour, position, consistency, mobility, non-tender              Adnexa: no mass, fullness, tenderness              Rectal exam: {yes no:314532}.  Confirms.              Anus:  normal sphincter tone, no lesions  Chaperone was  present for exam.  Assessment:   Well woman visit with normal exam.   Plan: Mammogram screening discussed. Recommended self breast awareness. Pap and HR HPV as above. Guidelines for Calcium, Vitamin D, regular exercise program including cardiovascular and weight bearing exercise.   Follow up annually and prn.   Additional counseling given.  {yes [id number]. _______ minutes face to face time of which over 50% was spent in counseling.    After visit summary provided.

## 2016-11-18 NOTE — Telephone Encounter (Signed)
Call to patient to reschedule annual exam for 11-19-16 due to Dr Elza Rafter jury duty. Left message on cell number to call back to office. Appointments available as early as Monday or Tuesday of next week.  Unable to leave message on home number X 2 attempts.

## 2016-11-19 ENCOUNTER — Ambulatory Visit: Payer: Medicare Other | Admitting: Obstetrics and Gynecology

## 2016-11-19 NOTE — Telephone Encounter (Signed)
Forwarding to [facility] for assistance with rescheduling.

## 2016-11-19 NOTE — Telephone Encounter (Signed)
Consider alternative phone number for emergency contact?  [place]

## 2016-11-19 NOTE — Telephone Encounter (Signed)
Call to patient. Appointment rescheduled to 11-25-16 per patient request. Declined earlier appointments offered.  Encounter closed.

## 2016-11-25 ENCOUNTER — Encounter: Payer: Self-pay | Admitting: Obstetrics and Gynecology

## 2016-11-25 ENCOUNTER — Ambulatory Visit (INDEPENDENT_AMBULATORY_CARE_PROVIDER_SITE_OTHER): Payer: Medicare Other | Admitting: Obstetrics and Gynecology

## 2016-11-25 ENCOUNTER — Other Ambulatory Visit: Payer: Self-pay

## 2016-11-25 VITALS — BP 110/62 | HR 50 | Resp 16 | Ht 64.5 in | Wt 129.6 lb

## 2016-11-25 DIAGNOSIS — M8580 Other specified disorders of bone density and structure, unspecified site: Secondary | ICD-10-CM

## 2016-11-25 DIAGNOSIS — Z780 Asymptomatic menopausal state: Secondary | ICD-10-CM | POA: Diagnosis not present

## 2016-11-25 DIAGNOSIS — Z01419 Encounter for gynecological examination (general) (routine) without abnormal findings: Secondary | ICD-10-CM

## 2016-11-25 NOTE — Patient Instructions (Signed)

## 2016-11-25 NOTE — Progress Notes (Signed)
76 y.o. G0P0000 Married Caucasian female here for annual exam.    Ok ERT and doing well.   Labs and vaccines with PCP.   PCP:   Domenick Gong, MD.  No LMP recorded. Patient has had a hysterectomy.           Sexually active: Yes.   female The current method of family planning is status post hysterectomy.    Exercising: Yes.    Elliptical, weights, stretches Smoker:  no  Health Maintenance: Pap: 2000 Neg History of abnormal Pap:  no MMG: 07-27-16 Density C/Neg/BiRads1:[facility]. Colonoscopy: 2009 Neg with Dr.Hung. Aged out BMD: 04-03-14  Result Osteopenia with PCP TDaP:  PCP Gardasil:   no HIV: Unsure Hep C:Had Hepatitis of some sort 25 years ago Screening Labs:  Hb today: PCP, Urine today: not done   reports that  has never smoked. she has never used smokeless tobacco. She reports that she drinks about 0.6 - 1.2 oz of alcohol per week. She reports that she does not use drugs.  Past Medical History:  Diagnosis Date  . Arthritis, hip   . Elevated cholesterol   . Endometriosis   . Fibroid   . 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 Hard of hearing    hearing aids  . Migraines   . Thyroid disease    hypo    Past Surgical History:  Procedure Laterality Date  . TOTAL ABDOMINAL HYSTERECTOMY  1981   bilat oophorectomy    Current Outpatient Medications  Medication Sig Dispense Refill  . levothyroxine (SYNTHROID, LEVOTHROID) 75 MCG tablet Take 1 tablet daily by mouth.    Marland Kitchen acetaminophen (TYLENOL) 500 MG tablet Take 1,000 mg by mouth 2 (two) times daily.    Marland Kitchen aspirin 81 MG tablet Take 81 mg by mouth daily.    . Calcium Carb-Cholecalciferol (CALCIUM PLUS VITAMIN D3) 600-500 MG-UNIT CAPS Take by mouth. Takes 1/2 tablet daily    . Cholecalciferol (VITAMIN D3) 2000 UNITS capsule Take 1 capsule by mouth daily.    . rizatriptan (MAXALT) 10 MG tablet Take 10 mg by mouth as needed for migraine. May repeat in 2 hours if needed    . simvastatin (ZOCOR) 5 MG tablet      No current  facility-administered medications for this visit.     Family History  Problem Relation Age of Onset  . Cancer Mother        pancreatic cancer  . Osteoporosis Mother   . Dementia Father   . Cancer Maternal Aunt        pancreatic cancer    ROS:  Pertinent items are noted in HPI.  Otherwise, a comprehensive ROS was negative.  Exam:   BP 110/62 (BP Location: Right Arm, Patient Position: Sitting, Cuff Size: Normal)   Pulse (!) 50   Resp 16   Ht 5' 4.5" (1.638 m)   Wt 129 lb 9.6 oz (58.8 kg)   BMI 21.90 kg/m     General appearance: alert, cooperative and appears stated age Head: Normocephalic, without obvious abnormality, atraumatic Neck: no adenopathy, supple, symmetrical, trachea midline and thyroid normal to inspection and palpation Lungs: clear to auscultation bilaterally Breasts: normal appearance, no masses or tenderness, No nipple retraction or dimpling, No nipple discharge or bleeding, No axillary or supraclavicular adenopathy Heart: regular rate and rhythm Abdomen: soft, non-tender; no masses, no organomegaly Extremities: extremities normal, atraumatic, no cyanosis or edema Skin: Skin color, texture, turgor normal. No rashes or lesions Lymph nodes: Cervical, supraclavicular, and axillary  nodes normal. No abnormal inguinal nodes palpated Neurologic: Grossly normal  Pelvic: External genitalia:  no lesions              Urethra:  normal appearing urethra with no masses, tenderness or lesions              Bartholins and Skenes: normal                 Vagina: normal appearing vagina with normal color and discharge, no lesions              Cervix:  Absent.               Pap taken: No. Bimanual Exam:  Uterus:   Absent.               Adnexa: no mass, fullness, tenderness              Rectal exam: Yes.  .  Confirms.              Anus:  normal sphincter tone, no lesions  Chaperone was present for exam.  Assessment:   Well woman visit with normal exam. Status psot TAH/BSO.   Osteopenia.  Off ERT.   Plan: Mammogram screening discussed. Recommended self breast awareness. Pap and HR HPV as above. Guidelines for Calcium, Vitamin D, regular exercise program including cardiovascular and weight bearing exercise. BMD at [facility]. Labs and vaccines with PCP.  Follow up annually and prn.   After visit summary provided.

## 2016-12-28 ENCOUNTER — Inpatient Hospital Stay: Admission: RE | Admit: 2016-12-28 | Payer: Medicare Other | Source: Ambulatory Visit

## 2017-01-11 ENCOUNTER — Ambulatory Visit
Admission: RE | Admit: 2017-01-11 | Discharge: 2017-01-11 | Disposition: A | Discharge status: Home / Self Care | Payer: Medicare Other | Source: Ambulatory Visit | Attending: Obstetrics and Gynecology | Admitting: Obstetrics and Gynecology

## 2017-01-11 DIAGNOSIS — M8580 Other specified disorders of bone density and structure, unspecified site: Secondary | ICD-10-CM

## 2017-01-11 DIAGNOSIS — Z780 Asymptomatic menopausal state: Secondary | ICD-10-CM

## 2017-06-16 ENCOUNTER — Other Ambulatory Visit: Payer: Self-pay | Admitting: Obstetrics and Gynecology

## 2017-06-16 DIAGNOSIS — Z1231 Encounter for screening mammogram for malignant neoplasm of breast: Secondary | ICD-10-CM

## 2017-07-29 ENCOUNTER — Ambulatory Visit
Admission: RE | Admit: 2017-07-29 | Discharge: 2017-07-29 | Disposition: A | Discharge status: Home / Self Care | Payer: Medicare Other | Source: Ambulatory Visit | Attending: Obstetrics and Gynecology | Admitting: Obstetrics and Gynecology

## 2017-07-29 DIAGNOSIS — Z1231 Encounter for screening mammogram for malignant neoplasm of breast: Secondary | ICD-10-CM

## 2017-12-02 NOTE — Progress Notes (Signed)
77 y.o. [place] Married Caucasian female here for annual exam.    Doing well off estrogen.   Urinary frequency but not incontinence.   Husband with Parkinsons.  PCP:   Domenick Gong, MD  No LMP recorded. Patient has had a hysterectomy.           Sexually active: Yes.   female The current method of family planning is status post hysterectomy.    Exercising: Yes.    elliptical, weights and stretches Smoker:  no  Health Maintenance: Pap: 2000 Neg History of abnormal Pap:  no MMG: 07-29-17 3D Neg/density B/BiRads1 Colonoscopy: 2009 normal;no further testing BMD: 01-11-17  Result :Osteopenia TDaP:  PCP Gardasil:   no HIV:no Hep C: Dx'd with some type of Hepatitis 25 years ago--unsure what type Screening Labs: PCP. Shingrix - 11/2017  Had flu vaccine.    reports that she has never smoked. She has never used smokeless tobacco. She reports that she drinks about 1.0 standard drinks of alcohol per week. She reports that she does not use drugs.  Past Medical History:  Diagnosis Date  . Arthritis, hip   . Elevated cholesterol   . Endometriosis   . Fibroid   . 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 Hard of hearing    hearing aids  . Migraines   . Thyroid disease    hypo    Past Surgical History:  Procedure Laterality Date  . TOTAL ABDOMINAL HYSTERECTOMY  1981   bilat oophorectomy    Current Outpatient Medications  Medication Sig Dispense Refill  . acetaminophen (TYLENOL) 500 MG tablet Take 1,000 mg by mouth 2 (two) times daily.    Marland Kitchen aspirin 81 MG tablet Take 81 mg by mouth daily.    . Calcium Carb-Cholecalciferol (CALCIUM PLUS VITAMIN D3) 600-500 MG-UNIT CAPS Take by mouth. Takes 1/2 tablet daily    . Cholecalciferol (VITAMIN D3) 2000 UNITS capsule Take 1 capsule by mouth daily.    Marland Kitchen glucosamine-chondroitin 500-400 MG tablet Take 1 tablet by mouth 2 (two) times daily.    Marland Kitchen levothyroxine (SYNTHROID, LEVOTHROID) 75 MCG tablet Take 1 tablet daily by mouth.    . rizatriptan  (MAXALT) 10 MG tablet Take 10 mg by mouth as needed for migraine. May repeat in 2 hours if needed    . simvastatin (ZOCOR) 5 MG tablet      No current facility-administered medications for this visit.     Family History  Problem Relation Age of Onset  . Cancer Mother        pancreatic cancer  . Osteoporosis Mother   . Dementia Father   . Cancer Maternal Aunt        pancreatic cancer    Review of Systems  All other systems reviewed and are negative.   Exam:   BP 112/68   Pulse 64   Resp 16   Ht 5' 4.5" (1.638 m)   Wt 122 lb 6.4 oz (55.5 kg)   BMI 20.69 kg/m     General appearance: alert, cooperative and appears stated age Head: Normocephalic, without obvious abnormality, atraumatic Neck: no adenopathy, supple, symmetrical, trachea midline and thyroid normal to inspection and palpation Lungs: clear to auscultation bilaterally Breasts: normal appearance, no masses or tenderness, No nipple retraction or dimpling, No nipple discharge or bleeding, No axillary or supraclavicular adenopathy Heart: regular rate and rhythm Abdomen: soft, non-tender; no masses, no organomegaly Extremities: extremities normal, atraumatic, no cyanosis or edema Skin: Skin color, texture, turgor normal. No rashes or  lesions Lymph nodes: Cervical, supraclavicular, and axillary nodes normal. No abnormal inguinal nodes palpated Neurologic: Grossly normal  Pelvic: External genitalia:  no lesions              Urethra:  normal appearing urethra with no masses, tenderness or lesions              Bartholins and Skenes: normal                 Vagina:  Atrophy noted.               Cervix: absent              Pap taken: No. Bimanual Exam:  Uterus:   absent              Adnexa: no mass, fullness, tenderness              Rectal exam: Yes.  .  Confirms.              Anus:  normal sphincter tone, no lesions  Chaperone was present for exam.  Assessment:   Well woman visit with normal exam. Status post  TAH/BSO.  Osteopenia.  Vaginal atrophy.   Plan: Mammogram screening. Recommended self breast awareness. Pap and HR HPV as above. Guidelines for Calcium, Vitamin D, regular exercise program including cardiovascular and weight bearing exercise. Labs with PCP.  BMD next year.  I discussed potential vaginal estrogen if needed.  No Rx today.  Follow up annually and prn.   After visit summary provided.

## 2017-12-03 ENCOUNTER — Ambulatory Visit (INDEPENDENT_AMBULATORY_CARE_PROVIDER_SITE_OTHER): Payer: Medicare Other | Admitting: Obstetrics and Gynecology

## 2017-12-03 ENCOUNTER — Other Ambulatory Visit: Payer: Self-pay

## 2017-12-03 ENCOUNTER — Encounter: Payer: Self-pay | Admitting: Obstetrics and Gynecology

## 2017-12-03 VITALS — BP 112/68 | HR 64 | Resp 16 | Ht 64.5 in | Wt 122.4 lb

## 2017-12-03 DIAGNOSIS — Z01419 Encounter for gynecological examination (general) (routine) without abnormal findings: Secondary | ICD-10-CM | POA: Diagnosis not present

## 2017-12-03 NOTE — Patient Instructions (Signed)

## 2018-06-16 ENCOUNTER — Other Ambulatory Visit: Payer: Self-pay | Admitting: Obstetrics and Gynecology

## 2018-06-16 DIAGNOSIS — Z1231 Encounter for screening mammogram for malignant neoplasm of breast: Secondary | ICD-10-CM

## 2018-08-03 ENCOUNTER — Ambulatory Visit
Admission: RE | Admit: 2018-08-03 | Discharge: 2018-08-03 | Disposition: A | Discharge status: Home / Self Care | Payer: Medicare Other | Source: Ambulatory Visit | Attending: Obstetrics and Gynecology | Admitting: Obstetrics and Gynecology

## 2018-08-03 DIAGNOSIS — Z1231 Encounter for screening mammogram for malignant neoplasm of breast: Secondary | ICD-10-CM

## 2018-12-05 ENCOUNTER — Other Ambulatory Visit: Payer: Self-pay

## 2018-12-08 ENCOUNTER — Ambulatory Visit (INDEPENDENT_AMBULATORY_CARE_PROVIDER_SITE_OTHER): Payer: Medicare Other | Admitting: Obstetrics and Gynecology

## 2018-12-08 ENCOUNTER — Other Ambulatory Visit: Payer: Self-pay

## 2018-12-08 ENCOUNTER — Encounter: Payer: Self-pay | Admitting: Obstetrics and Gynecology

## 2018-12-08 VITALS — BP 100/64 | HR 78 | Temp 97.5°F | Ht 64.5 in | Wt 124.0 lb

## 2018-12-08 DIAGNOSIS — Z01419 Encounter for gynecological examination (general) (routine) without abnormal findings: Secondary | ICD-10-CM | POA: Diagnosis not present

## 2018-12-08 DIAGNOSIS — Z780 Asymptomatic menopausal state: Secondary | ICD-10-CM | POA: Diagnosis not present

## 2018-12-08 NOTE — Patient Instructions (Signed)

## 2018-12-08 NOTE — Progress Notes (Signed)
78 y.o. G0P0000 Married white female here for annual exam.    Doing her regular check ups to stay healthy to care for her husband.  He has Parkinson's.  She has some back pain and is getter shorter per her report.   PCP:   Dr. Domenick Gong   No LMP recorded. Patient has had a hysterectomy.           Sexually active: Yes.    The current method of family planning is post menopausal status.    Exercising: Yes.    Eliptical , walking, weight lifting , and stretching  Smoker:  no  Health Maintenance: Pap:  2000 Neg History of abnormal Pap:  no MMG:  08/04/18 Density B Bi-rads 1 neg  Colonoscopy:   2009 normal;no further testing BMD:  01-11-17  Result Osteopenia  TDaP:  PCP.   Gardasil:   no HIV:no Hep C:Dx'd with some type of Hepatitis 25 years ago--unsure what type Screening Labs:  Hb today: PCP, Urine today: none Flu vaccine:  Completed. Shingrix:  Completed.    reports that she has never smoked. She has never used smokeless tobacco. She reports current alcohol use of about 1.0 standard drinks of alcohol per week. She reports that she does not use drugs.  Past Medical History:  Diagnosis Date  . Arthritis, hip   . Elevated cholesterol   . Endometriosis   . Fibroid   . 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 Hard of hearing    hearing aids  . Migraines   . Thyroid disease    hypo    Past Surgical History:  Procedure Laterality Date  . TOTAL ABDOMINAL HYSTERECTOMY  1981   bilat oophorectomy    Current Outpatient Medications  Medication Sig Dispense Refill  . acetaminophen (TYLENOL) 500 MG tablet Take 1,000 mg by mouth 2 (two) times daily.    Marland Kitchen aspirin 81 MG tablet Take 81 mg by mouth daily.    . Calcium Carb-Cholecalciferol (CALCIUM PLUS VITAMIN D3) 600-500 MG-UNIT CAPS Take by mouth. Takes 1/2 tablet daily    . Cholecalciferol (VITAMIN D3) 2000 UNITS capsule Take 1 capsule by mouth daily.    Marland Kitchen glucosamine-chondroitin 500-400 MG tablet Take 1 tablet by mouth 2  (two) times daily.    Marland Kitchen levothyroxine (SYNTHROID, LEVOTHROID) 75 MCG tablet Take 1 tablet daily by mouth.    . rizatriptan (MAXALT) 10 MG tablet Take 10 mg by mouth as needed for migraine. May repeat in 2 hours if needed    . simvastatin (ZOCOR) 5 MG tablet      No current facility-administered medications for this visit.     Family History  Problem Relation Age of Onset  . Cancer Mother        pancreatic cancer  . Osteoporosis Mother   . Dementia Father   . Cancer Maternal Aunt        pancreatic cancer    Review of Systems  All other systems reviewed and are negative.   Exam:   There were no vitals taken for this visit.    General appearance: alert, cooperative and appears stated age Head: normocephalic, without obvious abnormality, atraumatic Neck: no adenopathy, supple, symmetrical, trachea midline and thyroid normal to inspection and palpation Lungs: clear to auscultation bilaterally Breasts: normal appearance, no masses or tenderness, No nipple retraction or dimpling, No nipple discharge or bleeding, No axillary adenopathy Heart: regular rate and rhythm Abdomen: soft, non-tender; no masses, no organomegaly Extremities: extremities normal, atraumatic, no  cyanosis or edema Skin: skin color, texture, turgor normal. No rashes or lesions Lymph nodes: cervical, supraclavicular, and axillary nodes normal. Neurologic: grossly normal  Pelvic: External genitalia:  no lesions              No abnormal inguinal nodes palpated.              Urethra:  normal appearing urethra with no masses, tenderness or lesions              Bartholins and Skenes: normal                 Vagina: normal appearing vagina with normal color and discharge, no lesions              Cervix:  absent              Pap taken: No. Bimanual Exam:  Uterus:  absent              Adnexa: no mass, fullness, tenderness              Rectal exam: Yes.  .  Confirms.              Anus:  normal sphincter tone, no  lesions  Chaperone was present for exam.  Assessment:   Well woman visit with normal exam. Status post TAH/BSO.  Osteopenia.  Vaginal atrophy.   Plan: Mammogram screening discussed. Self breast awareness reviewed. Pap and HR HPV as above. Guidelines for Calcium, Vitamin D, regular exercise program including cardiovascular and weight bearing exercise. Labs with PCP.  BMD next year with mammogram. Follow up annually and prn.  After visit summary provided.

## 2019-02-15 ENCOUNTER — Ambulatory Visit: Payer: Medicare Other

## 2019-02-24 ENCOUNTER — Ambulatory Visit: Payer: Medicare PPO | Attending: Internal Medicine

## 2019-02-24 DIAGNOSIS — Z23 Encounter for immunization: Secondary | ICD-10-CM

## 2019-02-24 NOTE — Progress Notes (Signed)
   Covid-19 Vaccination Clinic  Name:  Cheryl Bullock    MRN: [id number] DOB: [birth year]  02/24/2019  Cheryl Bullock was observed post Covid-19 immunization for 15 minutes without incidence. She was provided with Vaccine Information Sheet and instruction to access the V-Safe system.   Cheryl Bullock was instructed to call 911 with any severe reactions post vaccine: Marland Kitchen Difficulty breathing  . Swelling of your face and throat  . A fast heartbeat  . A bad rash all over your body  . Dizziness and weakness    Immunizations Administered    Name Date Dose VIS Date Route   Pfizer COVID-19 Vaccine 02/24/2019  8:56 AM 0.3 mL 12/30/2018 Intramuscular   Manufacturer: [place]   Lot: [id number]   [place]: [id number]

## 2019-03-21 ENCOUNTER — Ambulatory Visit: Payer: Medicare PPO | Attending: Internal Medicine

## 2019-03-21 DIAGNOSIS — Z23 Encounter for immunization: Secondary | ICD-10-CM

## 2019-03-21 NOTE — Progress Notes (Signed)
   Covid-19 Vaccination Clinic  Name:  Cheryl Bullock    MRN: [id number] DOB: 1940-10-14  03/21/2019  [name] was observed post Covid-19 immunization for 15 minutes without incident. She was provided with Vaccine Information Sheet and instruction to access the V-Safe system.   [name] was instructed to call 911 with any severe reactions post vaccine: Marland Kitchen Difficulty breathing  . Swelling of face and throat  . A fast heartbeat  . A bad rash all over body  . Dizziness and weakness   Immunizations Administered    Name Date Dose VIS Date Route   Pfizer COVID-19 Vaccine 03/21/2019  9:31 AM 0.3 mL 12/30/2018 Intramuscular   Manufacturer: [place]   Lot: [id number]   [place]: [id number]

## 2019-04-18 ENCOUNTER — Other Ambulatory Visit: Payer: Self-pay | Admitting: Obstetrics and Gynecology

## 2019-04-18 DIAGNOSIS — Z1231 Encounter for screening mammogram for malignant neoplasm of breast: Secondary | ICD-10-CM

## 2019-04-18 DIAGNOSIS — Z780 Asymptomatic menopausal state: Secondary | ICD-10-CM

## 2019-08-04 ENCOUNTER — Ambulatory Visit: Payer: Medicare PPO

## 2019-08-04 ENCOUNTER — Other Ambulatory Visit: Payer: Medicare PPO

## 2019-08-09 ENCOUNTER — Other Ambulatory Visit: Payer: Self-pay | Admitting: Obstetrics and Gynecology

## 2019-08-09 DIAGNOSIS — E2839 Other primary ovarian failure: Secondary | ICD-10-CM

## 2019-08-11 ENCOUNTER — Other Ambulatory Visit: Payer: Self-pay

## 2019-08-11 ENCOUNTER — Ambulatory Visit
Admission: RE | Admit: 2019-08-11 | Discharge: 2019-08-11 | Disposition: A | Discharge status: Home / Self Care | Payer: Medicare PPO | Source: Ambulatory Visit | Attending: Obstetrics and Gynecology | Admitting: Obstetrics and Gynecology

## 2019-08-11 ENCOUNTER — Other Ambulatory Visit: Payer: Medicare PPO

## 2019-08-11 DIAGNOSIS — Z1231 Encounter for screening mammogram for malignant neoplasm of breast: Secondary | ICD-10-CM

## 2019-09-26 ENCOUNTER — Ambulatory Visit
Admission: RE | Admit: 2019-09-26 | Discharge: 2019-09-26 | Disposition: A | Discharge status: Home / Self Care | Payer: Medicare PPO | Source: Ambulatory Visit | Attending: Obstetrics and Gynecology | Admitting: Obstetrics and Gynecology

## 2019-09-26 ENCOUNTER — Other Ambulatory Visit: Payer: Self-pay

## 2019-09-26 DIAGNOSIS — E2839 Other primary ovarian failure: Secondary | ICD-10-CM

## 2019-09-28 ENCOUNTER — Telehealth: Payer: Self-pay | Admitting: *Deleted

## 2019-09-28 NOTE — Telephone Encounter (Signed)
Patient returned a call to Jill.   

## 2019-09-28 NOTE — Telephone Encounter (Signed)
Spoke with patient. Advised per Dr. Quincy Simmonds. Patient declines OV at this time, will plan to further discuss at 12/12/19 AEX.   Patient verbalizes understanding and is agreeable.   Encounter closed.

## 2019-09-28 NOTE — Telephone Encounter (Signed)
Burnice Logan, RN  09/28/2019 10:04 AM EDT Back to Top    Left message to call Sharee Pimple, RN at [facility].

## 2019-09-28 NOTE — Telephone Encounter (Signed)
-----   Message from Nunzio Cobbs, MD sent at 09/27/2019  5:25 PM EDT ----- Please contact patient with results of her BMD showing osteopenia of the hip and spine and increased risk of fracture.   Please offer an office visit to discuss further.  Treatment with prescription medication to reduce fracture risk is an option.  I do recommend she continue her calcium, vitamin D and do regular weight bearing exercise.

## 2019-10-21 DIAGNOSIS — Z23 Encounter for immunization: Secondary | ICD-10-CM | POA: Diagnosis not present

## 2019-12-07 ENCOUNTER — Telehealth: Payer: Self-pay

## 2019-12-07 NOTE — Telephone Encounter (Signed)
Left message for pt to return call to triage RN. 

## 2019-12-07 NOTE — Telephone Encounter (Signed)
Patient would like a call from nurse to go over bone density results.

## 2019-12-11 NOTE — Telephone Encounter (Signed)
Spoke with Cheryl Bullock. Cheryl Bullock wanting to know what the recommendations per Dr Quincy Simmonds on BMD from Sept. Cheryl Bullock advised per result note. Cheryl Bullock would like to discuss more at AEX. AEX rescheduled for 12/9 at 11 am. Cheryl Bullock agreeable to date and time of appt.  Encounter closed

## 2019-12-12 ENCOUNTER — Ambulatory Visit: Payer: Medicare Other | Admitting: Obstetrics and Gynecology

## 2019-12-28 ENCOUNTER — Ambulatory Visit: Payer: Medicare PPO | Admitting: Obstetrics and Gynecology

## 2020-01-02 ENCOUNTER — Ambulatory Visit: Payer: Medicare PPO | Admitting: Obstetrics and Gynecology

## 2020-01-02 ENCOUNTER — Encounter: Payer: Self-pay | Admitting: Obstetrics and Gynecology

## 2020-01-02 ENCOUNTER — Ambulatory Visit (INDEPENDENT_AMBULATORY_CARE_PROVIDER_SITE_OTHER): Payer: Medicare PPO | Admitting: Obstetrics and Gynecology

## 2020-01-02 ENCOUNTER — Other Ambulatory Visit: Payer: Self-pay

## 2020-01-02 VITALS — BP 108/60 | HR 74 | Ht 64.25 in | Wt 124.0 lb

## 2020-01-02 DIAGNOSIS — Z01419 Encounter for gynecological examination (general) (routine) without abnormal findings: Secondary | ICD-10-CM

## 2020-01-02 DIAGNOSIS — Z636 Dependent relative needing care at home: Secondary | ICD-10-CM | POA: Diagnosis not present

## 2020-01-02 DIAGNOSIS — M8580 Other specified disorders of bone density and structure, unspecified site: Secondary | ICD-10-CM | POA: Diagnosis not present

## 2020-01-02 NOTE — Patient Instructions (Signed)

## 2020-01-02 NOTE — Progress Notes (Signed)
79 y.o. G50P0000 Married Caucasian female here for annual exam.    Patient complaining of low to mid back pain. She feels this is probably some arthritis. She does some physical work in the care of her husband.  She does rake leaves at her home. She uses Tylenol and possibly ibuprofen.   She wants to understand her bone density scan.  Husband has Parkinson's and is at home.  His daughters are helpful.  There are no caregivers.   Completed her Covid booster and flu vaccine.   PCP: Domenick Gong, MD   No LMP recorded. Patient has had a hysterectomy.           Sexually active: Yes.   some intimacy The current method of family planning is post Hysterectomy.    Exercising: No.  Tries going to gym 2 days/week -- elliptical and weights  Smoker:  no  Health Maintenance: Pap: 2000 Neg History of abnormal Pap:  no MMG: 08-11-19 3D/Neg/density B/Birads1 Colonoscopy: 2009 normal;no further testing BMD: 09-26-19  Result :Osteopenia of hip and spine TDaP:  PCP Gardasil:   no HIV: no Hep C: :Dx'd with some type of Hepatitis 25 years ago--unsure what type Screening Labs:  PCP.    reports that she has never smoked. She has never used smokeless tobacco. She reports current alcohol use of about 1.0 standard drink of alcohol per week. She reports that she does not use drugs.  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     aspirin 81 MG tablet Take 81 mg by mouth daily.     Calcium Carb-Cholecalciferol 600-500 MG-UNIT CAPS Take by mouth.  Takes 1/2 tablet daily     Cholecalciferol (VITAMIN D3) 2000 UNITS capsule Take 1 capsule by mouth daily.     glucosamine-chondroitin 500-400 MG tablet Take 1 tablet by mouth 2 (two) times daily.     levothyroxine (SYNTHROID, LEVOTHROID) 75 MCG tablet Take 1 tablet daily by mouth.     Multiple Vitamin (MULTIVITAMIN) capsule Take 1 capsule by mouth daily.     rizatriptan (MAXALT) 10 MG tablet Take 10 mg by mouth as needed for migraine. May repeat in 2 hours if needed     simvastatin (ZOCOR) 5 MG tablet      No current facility-administered medications for this visit.    Family History  Problem Relation Age of Onset   Cancer Mother        pancreatic cancer   Osteoporosis Mother    Dementia Father    Cancer Maternal Aunt        pancreatic cancer    Review of Systems  Musculoskeletal: Positive for back pain (low to mid back pain).  All other systems reviewed and are negative.   Exam:   BP 108/60    Pulse 74    Ht 5' 4.25" (1.632 m)    Wt 124 lb (56.2 kg)    SpO2 99%    BMI 21.12 kg/m  General appearance: alert, cooperative and appears stated age Head: normocephalic, without obvious abnormality, atraumatic Neck: no adenopathy, supple, symmetrical, trachea midline and thyroid normal to inspection and palpation Lungs: clear to auscultation bilaterally Breasts: normal appearance, no masses or tenderness, No nipple retraction or dimpling, No nipple discharge or bleeding, No axillary adenopathy Heart: regular rate and rhythm Abdomen: soft, non-tender; no masses, no organomegaly Extremities: extremities normal, atraumatic, no cyanosis or edema Skin: skin color, texture, turgor normal. No rashes or lesions Lymph nodes: cervical, supraclavicular, and axillary nodes normal. Neurologic: grossly normal  Pelvic: External genitalia:  no lesions              No abnormal inguinal nodes palpated.              Urethra:  normal appearing urethra with no masses, tenderness or  lesions              Bartholins and Skenes: normal                 Vagina: normal appearing vagina with normal color and discharge, no lesions.  Atrophy noted.               Cervix: absent              Pap taken: No. Bimanual Exam:  Uterus:  absent              Adnexa: no mass, fullness, tenderness              Rectal exam: Yes.  .  Confirms.              Anus:  normal sphincter tone, no lesions  Chaperone was present for exam.  Assessment:   Well woman visit with normal exam. Status post TAH/BSO.  Osteopenia.  Increased risk of fracture by FRAX model. Vaginal atrophy. Low back pain.  Caregiver status and some burden.  Plan: Mammogram screening discussed. Self breast awareness reviewed. Pap and HR HPV as above. Guidelines for Calcium, Vitamin D, regular exercise program including cardiovascular and weight bearing exercise. Bone density report and osteopenia reviewed.  Repeat BMD in 2 years.  She declines bisphosphonates and Evista.  Support given for the care of her husband.  We exchanged ideas of how to increase support for her and her husband.  She will reach out to her PCP if her back pain continues.  Follow up annually and prn.

## 2020-02-03 DIAGNOSIS — Z03818 Encounter for observation for suspected exposure to other biological agents ruled out: Secondary | ICD-10-CM | POA: Diagnosis not present

## 2020-05-06 ENCOUNTER — Ambulatory Visit: Payer: Medicare PPO | Admitting: Obstetrics and Gynecology

## 2020-07-01 ENCOUNTER — Other Ambulatory Visit: Payer: Self-pay | Admitting: Obstetrics and Gynecology

## 2020-07-01 DIAGNOSIS — Z1231 Encounter for screening mammogram for malignant neoplasm of breast: Secondary | ICD-10-CM

## 2020-07-02 DIAGNOSIS — D1724 Benign lipomatous neoplasm of skin and subcutaneous tissue of left leg: Secondary | ICD-10-CM | POA: Diagnosis not present

## 2020-07-02 DIAGNOSIS — L821 Other seborrheic keratosis: Secondary | ICD-10-CM | POA: Diagnosis not present

## 2020-07-02 DIAGNOSIS — D1801 Hemangioma of skin and subcutaneous tissue: Secondary | ICD-10-CM | POA: Diagnosis not present

## 2020-07-02 DIAGNOSIS — B882 Other arthropod infestations: Secondary | ICD-10-CM | POA: Diagnosis not present

## 2020-07-24 DIAGNOSIS — M25562 Pain in left knee: Secondary | ICD-10-CM | POA: Diagnosis not present

## 2020-07-24 DIAGNOSIS — M25561 Pain in right knee: Secondary | ICD-10-CM | POA: Diagnosis not present

## 2020-08-22 ENCOUNTER — Other Ambulatory Visit: Payer: Self-pay

## 2020-08-22 ENCOUNTER — Ambulatory Visit
Admission: RE | Admit: 2020-08-22 | Discharge: 2020-08-22 | Disposition: A | Discharge status: Home / Self Care | Payer: Medicare PPO | Source: Ambulatory Visit | Attending: Obstetrics and Gynecology | Admitting: Obstetrics and Gynecology

## 2020-08-22 DIAGNOSIS — Z1231 Encounter for screening mammogram for malignant neoplasm of breast: Secondary | ICD-10-CM

## 2020-09-19 DIAGNOSIS — M25561 Pain in right knee: Secondary | ICD-10-CM | POA: Diagnosis not present

## 2020-09-19 DIAGNOSIS — M25552 Pain in left hip: Secondary | ICD-10-CM | POA: Diagnosis not present

## 2020-09-26 DIAGNOSIS — E7800 Pure hypercholesterolemia, unspecified: Secondary | ICD-10-CM | POA: Diagnosis not present

## 2020-09-26 DIAGNOSIS — E039 Hypothyroidism, unspecified: Secondary | ICD-10-CM | POA: Diagnosis not present

## 2020-09-26 DIAGNOSIS — M859 Disorder of bone density and structure, unspecified: Secondary | ICD-10-CM | POA: Diagnosis not present

## 2020-10-03 DIAGNOSIS — M8580 Other specified disorders of bone density and structure, unspecified site: Secondary | ICD-10-CM | POA: Diagnosis not present

## 2020-10-03 DIAGNOSIS — G43009 Migraine without aura, not intractable, without status migrainosus: Secondary | ICD-10-CM | POA: Diagnosis not present

## 2020-10-03 DIAGNOSIS — E7800 Pure hypercholesterolemia, unspecified: Secondary | ICD-10-CM | POA: Diagnosis not present

## 2020-10-03 DIAGNOSIS — M1612 Unilateral primary osteoarthritis, left hip: Secondary | ICD-10-CM | POA: Diagnosis not present

## 2020-10-03 DIAGNOSIS — Z0000 Encounter for general adult medical examination without abnormal findings: Secondary | ICD-10-CM | POA: Diagnosis not present

## 2020-10-03 DIAGNOSIS — Z1331 Encounter for screening for depression: Secondary | ICD-10-CM | POA: Diagnosis not present

## 2020-10-03 DIAGNOSIS — Z23 Encounter for immunization: Secondary | ICD-10-CM | POA: Diagnosis not present

## 2020-10-03 DIAGNOSIS — Z1339 Encounter for screening examination for other mental health and behavioral disorders: Secondary | ICD-10-CM | POA: Diagnosis not present

## 2020-10-03 DIAGNOSIS — R82998 Other abnormal findings in urine: Secondary | ICD-10-CM | POA: Diagnosis not present

## 2020-10-03 DIAGNOSIS — E039 Hypothyroidism, unspecified: Secondary | ICD-10-CM | POA: Diagnosis not present

## 2020-10-23 DIAGNOSIS — M25552 Pain in left hip: Secondary | ICD-10-CM | POA: Diagnosis not present

## 2020-12-04 DIAGNOSIS — M25552 Pain in left hip: Secondary | ICD-10-CM | POA: Diagnosis not present

## 2020-12-23 DIAGNOSIS — M546 Pain in thoracic spine: Secondary | ICD-10-CM | POA: Diagnosis not present

## 2020-12-23 DIAGNOSIS — M5451 Vertebrogenic low back pain: Secondary | ICD-10-CM | POA: Diagnosis not present

## 2020-12-24 DIAGNOSIS — H524 Presbyopia: Secondary | ICD-10-CM | POA: Diagnosis not present

## 2020-12-24 DIAGNOSIS — Z961 Presence of intraocular lens: Secondary | ICD-10-CM | POA: Diagnosis not present

## 2020-12-24 DIAGNOSIS — H1789 Other corneal scars and opacities: Secondary | ICD-10-CM | POA: Diagnosis not present

## 2021-01-19 DIAGNOSIS — M810 Age-related osteoporosis without current pathological fracture: Secondary | ICD-10-CM

## 2021-01-19 HISTORY — DX: Age-related osteoporosis without current pathological fracture: M81.0

## 2021-02-06 DIAGNOSIS — M5451 Vertebrogenic low back pain: Secondary | ICD-10-CM | POA: Diagnosis not present

## 2021-02-06 DIAGNOSIS — M546 Pain in thoracic spine: Secondary | ICD-10-CM | POA: Diagnosis not present

## 2021-02-13 DIAGNOSIS — M5451 Vertebrogenic low back pain: Secondary | ICD-10-CM | POA: Diagnosis not present

## 2021-02-13 DIAGNOSIS — M546 Pain in thoracic spine: Secondary | ICD-10-CM | POA: Diagnosis not present

## 2021-02-19 DIAGNOSIS — M546 Pain in thoracic spine: Secondary | ICD-10-CM | POA: Diagnosis not present

## 2021-02-19 DIAGNOSIS — M5451 Vertebrogenic low back pain: Secondary | ICD-10-CM | POA: Diagnosis not present

## 2021-02-21 DIAGNOSIS — M546 Pain in thoracic spine: Secondary | ICD-10-CM | POA: Diagnosis not present

## 2021-02-21 DIAGNOSIS — M5451 Vertebrogenic low back pain: Secondary | ICD-10-CM | POA: Diagnosis not present

## 2021-02-24 DIAGNOSIS — M5451 Vertebrogenic low back pain: Secondary | ICD-10-CM | POA: Diagnosis not present

## 2021-02-24 DIAGNOSIS — M546 Pain in thoracic spine: Secondary | ICD-10-CM | POA: Diagnosis not present

## 2021-02-27 DIAGNOSIS — M546 Pain in thoracic spine: Secondary | ICD-10-CM | POA: Diagnosis not present

## 2021-02-27 DIAGNOSIS — M5451 Vertebrogenic low back pain: Secondary | ICD-10-CM | POA: Diagnosis not present

## 2021-03-03 DIAGNOSIS — M5451 Vertebrogenic low back pain: Secondary | ICD-10-CM | POA: Diagnosis not present

## 2021-03-03 DIAGNOSIS — M546 Pain in thoracic spine: Secondary | ICD-10-CM | POA: Diagnosis not present

## 2021-03-05 ENCOUNTER — Other Ambulatory Visit: Payer: Self-pay

## 2021-03-05 ENCOUNTER — Encounter: Payer: Self-pay | Admitting: Obstetrics and Gynecology

## 2021-03-05 ENCOUNTER — Ambulatory Visit (INDEPENDENT_AMBULATORY_CARE_PROVIDER_SITE_OTHER): Payer: Medicare PPO | Admitting: Obstetrics and Gynecology

## 2021-03-05 VITALS — BP 110/66 | HR 65 | Ht 64.0 in | Wt 116.0 lb

## 2021-03-05 DIAGNOSIS — M8580 Other specified disorders of bone density and structure, unspecified site: Secondary | ICD-10-CM

## 2021-03-05 DIAGNOSIS — Z01419 Encounter for gynecological examination (general) (routine) without abnormal findings: Secondary | ICD-10-CM

## 2021-03-05 DIAGNOSIS — Z634 Disappearance and death of family member: Secondary | ICD-10-CM | POA: Diagnosis not present

## 2021-03-05 NOTE — Progress Notes (Signed)
81 y.o. G0P0000 Widowed Caucasian female here for annual breast and pelvic exam.    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   Doing physical therapy for her back.   Husband passed 01-10-21. Patient was his caregiver.   PCP:  Domenick Gong, MD   No LMP recorded. Patient has had a hysterectomy.           Sexually active: No.  The current method of family planning is status post hysterectomy.    Exercising: No.   Trying to get back on regular basis--stretches/strength Smoker:  no  Health Maintenance: Pap:  2000 Neg History of abnormal Pap:  no MMG:  08-22-20 Neg/BiRads1 Colonoscopy:  2009 normal;no further testing BMD:   09-26-19  Result :Osteopenia. TDaP:  PCP Gardasil:   n/a HIV:no Hep C:Dx'd with some type of Hepatitis 25 years ago--unsure what type Screening Labs:  PCP Flu vaccine and covid booster:  completed.    reports that she has never smoked. She has never used smokeless tobacco. She reports current alcohol use of about 1.0 standard drink per week. She reports that she does not use drugs.  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     aspirin 81 MG tablet Take 81 mg by mouth daily.     Black Pepper-Turmeric (TURMERIC CURCUMIN) 05-998 MG CAPS      Calcium Carb-Cholecalciferol 600-500 MG-UNIT CAPS Take by mouth. Takes 1/2 tablet daily     Cholecalciferol (VITAMIN D3) 2000 UNITS capsule Take 1 capsule by mouth daily.     esomeprazole (NEXIUM) 40 MG capsule Take by mouth.     glucosamine-chondroitin 500-400 MG tablet Take  1 tablet by mouth 2 (two) times daily.     levothyroxine (SYNTHROID, LEVOTHROID) 75 MCG tablet Take 1 tablet daily by mouth.     Multiple Vitamin (MULTIVITAMIN) capsule Take 1 capsule by mouth daily.     rizatriptan (MAXALT) 10 MG tablet Take 1 tablet by mouth 3 (three) times daily as needed.     simvastatin (ZOCOR) 5 MG tablet Take 1 tablet by mouth daily.     meloxicam (MOBIC) 7.5 MG tablet meloxicam 7.5 mg tablet (Patient not taking: Reported on 03/05/2021)     No current facility-administered medications for this visit.    Family History  Problem Relation Age of Onset   Cancer Mother        pancreatic cancer   Osteoporosis Mother    Dementia Father    Cancer Maternal Aunt        pancreatic cancer    Review of Systems  All other systems reviewed and are negative.  Exam:   BP 110/66    Pulse 65    Ht 5\' 4"  (1.626 m)    Wt 116 lb (52.6 kg)    SpO2 97%    BMI 19.91 kg/m     General appearance: alert, cooperative and appears stated age Head: normocephalic, without obvious abnormality, atraumatic Neck: no  adenopathy, supple, symmetrical, trachea midline and thyroid normal to inspection and palpation Lungs: clear to auscultation bilaterally Breasts: normal appearance, no masses or tenderness, No nipple retraction or dimpling, No nipple discharge or bleeding, No axillary adenopathy Heart: regular rate and rhythm Abdomen: soft, non-tender; no masses, no organomegaly Extremities: extremities normal, atraumatic, no cyanosis or edema Skin: skin color, texture, turgor normal. No rashes or lesions Lymph nodes: cervical, supraclavicular, and axillary nodes normal. Neurologic: grossly normal  Pelvic: External genitalia:  no lesions              No abnormal inguinal nodes palpated.              Urethra:  normal appearing urethra with no masses, tenderness or lesions              Bartholins and Skenes: normal                 Vagina: normal appearing vagina with mild atrophy noted, no  lesions              Cervix: absent              Pap taken: no Bimanual Exam:  Uterus:  absent              Adnexa: no mass, fullness, tenderness              Rectal exam: Yes.  .  Confirms.              Anus:  normal sphincter tone, hemorrhoid noted.   Chaperone was present for exam:  Estill Bamberg, CMA  Assessment:   Well woman visit with gynecologic exam. Status post TAH/BSO.  Osteopenia.  Increased risk of fracture by FRAX model. Vaginal atrophy.  FH pancreatic cancer.  Bereavement.   Plan: Mammogram screening discussed. Self breast awareness reviewed. Pap and HR HPV as above. Guidelines for Calcium, Vitamin D, regular exercise program including cardiovascular and weight bearing exercise. BMD this fall.  Support given for the loss of her husband.  Fu in 2 years for breast and pelvic exam and prn.   After visit summary provided.   30 min  total time was spent for this patient encounter, including preparation, face-to-face counseling with the patient, coordination of care, and documentation of the encounter.

## 2021-03-05 NOTE — Patient Instructions (Signed)

## 2021-03-06 ENCOUNTER — Other Ambulatory Visit: Payer: Self-pay | Admitting: Obstetrics and Gynecology

## 2021-03-06 DIAGNOSIS — M546 Pain in thoracic spine: Secondary | ICD-10-CM | POA: Diagnosis not present

## 2021-03-06 DIAGNOSIS — Z1231 Encounter for screening mammogram for malignant neoplasm of breast: Secondary | ICD-10-CM

## 2021-03-06 DIAGNOSIS — M5451 Vertebrogenic low back pain: Secondary | ICD-10-CM | POA: Diagnosis not present

## 2021-03-10 DIAGNOSIS — M5451 Vertebrogenic low back pain: Secondary | ICD-10-CM | POA: Diagnosis not present

## 2021-03-10 DIAGNOSIS — M546 Pain in thoracic spine: Secondary | ICD-10-CM | POA: Diagnosis not present

## 2021-03-13 DIAGNOSIS — M5451 Vertebrogenic low back pain: Secondary | ICD-10-CM | POA: Diagnosis not present

## 2021-03-13 DIAGNOSIS — M546 Pain in thoracic spine: Secondary | ICD-10-CM | POA: Diagnosis not present

## 2021-03-24 DIAGNOSIS — M546 Pain in thoracic spine: Secondary | ICD-10-CM | POA: Diagnosis not present

## 2021-03-24 DIAGNOSIS — M5451 Vertebrogenic low back pain: Secondary | ICD-10-CM | POA: Diagnosis not present

## 2021-03-27 DIAGNOSIS — M546 Pain in thoracic spine: Secondary | ICD-10-CM | POA: Diagnosis not present

## 2021-03-27 DIAGNOSIS — M5451 Vertebrogenic low back pain: Secondary | ICD-10-CM | POA: Diagnosis not present

## 2021-03-31 DIAGNOSIS — M546 Pain in thoracic spine: Secondary | ICD-10-CM | POA: Diagnosis not present

## 2021-03-31 DIAGNOSIS — M5451 Vertebrogenic low back pain: Secondary | ICD-10-CM | POA: Diagnosis not present

## 2021-04-02 DIAGNOSIS — M5451 Vertebrogenic low back pain: Secondary | ICD-10-CM | POA: Diagnosis not present

## 2021-04-02 DIAGNOSIS — M546 Pain in thoracic spine: Secondary | ICD-10-CM | POA: Diagnosis not present

## 2021-04-07 DIAGNOSIS — M546 Pain in thoracic spine: Secondary | ICD-10-CM | POA: Diagnosis not present

## 2021-04-07 DIAGNOSIS — M5451 Vertebrogenic low back pain: Secondary | ICD-10-CM | POA: Diagnosis not present

## 2021-04-14 DIAGNOSIS — M546 Pain in thoracic spine: Secondary | ICD-10-CM | POA: Diagnosis not present

## 2021-04-14 DIAGNOSIS — M5451 Vertebrogenic low back pain: Secondary | ICD-10-CM | POA: Diagnosis not present

## 2021-04-17 DIAGNOSIS — M5451 Vertebrogenic low back pain: Secondary | ICD-10-CM | POA: Diagnosis not present

## 2021-04-17 DIAGNOSIS — M546 Pain in thoracic spine: Secondary | ICD-10-CM | POA: Diagnosis not present

## 2021-04-22 DIAGNOSIS — M5451 Vertebrogenic low back pain: Secondary | ICD-10-CM | POA: Diagnosis not present

## 2021-04-22 DIAGNOSIS — M546 Pain in thoracic spine: Secondary | ICD-10-CM | POA: Diagnosis not present

## 2021-05-01 DIAGNOSIS — M546 Pain in thoracic spine: Secondary | ICD-10-CM | POA: Diagnosis not present

## 2021-05-01 DIAGNOSIS — M5451 Vertebrogenic low back pain: Secondary | ICD-10-CM | POA: Diagnosis not present

## 2021-05-08 DIAGNOSIS — M546 Pain in thoracic spine: Secondary | ICD-10-CM | POA: Diagnosis not present

## 2021-05-08 DIAGNOSIS — M5451 Vertebrogenic low back pain: Secondary | ICD-10-CM | POA: Diagnosis not present

## 2021-06-05 DIAGNOSIS — L239 Allergic contact dermatitis, unspecified cause: Secondary | ICD-10-CM | POA: Diagnosis not present

## 2021-08-22 DIAGNOSIS — H903 Sensorineural hearing loss, bilateral: Secondary | ICD-10-CM | POA: Diagnosis not present

## 2021-09-26 ENCOUNTER — Ambulatory Visit
Admission: RE | Admit: 2021-09-26 | Discharge: 2021-09-26 | Disposition: A | Discharge status: Home / Self Care | Payer: Medicare PPO | Source: Ambulatory Visit | Attending: Obstetrics and Gynecology | Admitting: Obstetrics and Gynecology

## 2021-09-26 DIAGNOSIS — M8580 Other specified disorders of bone density and structure, unspecified site: Secondary | ICD-10-CM

## 2021-09-26 DIAGNOSIS — M810 Age-related osteoporosis without current pathological fracture: Secondary | ICD-10-CM | POA: Diagnosis not present

## 2021-09-26 DIAGNOSIS — Z1231 Encounter for screening mammogram for malignant neoplasm of breast: Secondary | ICD-10-CM | POA: Diagnosis not present

## 2021-09-26 DIAGNOSIS — Z780 Asymptomatic menopausal state: Secondary | ICD-10-CM | POA: Diagnosis not present

## 2021-09-26 DIAGNOSIS — M85851 Other specified disorders of bone density and structure, right thigh: Secondary | ICD-10-CM | POA: Diagnosis not present

## 2021-09-30 ENCOUNTER — Encounter: Payer: Self-pay | Admitting: Obstetrics and Gynecology

## 2021-10-09 ENCOUNTER — Ambulatory Visit: Payer: Medicare PPO | Admitting: Obstetrics and Gynecology

## 2021-10-20 ENCOUNTER — Ambulatory Visit: Payer: Medicare PPO | Admitting: Obstetrics and Gynecology

## 2021-10-20 ENCOUNTER — Encounter: Payer: Self-pay | Admitting: Obstetrics and Gynecology

## 2021-10-20 VITALS — BP 110/68 | Ht 64.0 in | Wt 116.0 lb

## 2021-10-20 DIAGNOSIS — M810 Age-related osteoporosis without current pathological fracture: Secondary | ICD-10-CM

## 2021-10-20 NOTE — Progress Notes (Signed)
GYNECOLOGY  VISIT   HPI: 81 y.o.   Widowed  Caucasian  female   G0P0000 with No LMP recorded. Patient has had a hysterectomy.   here for BMD consult.  She has done research about osteoporosis.   Hx reflux in the past.   Normal kidney function in the past.   Taking calcium 600 mg po bid and vit D 2000 IU.   Doing Silver Sneakers and water aerobids.   Doing PT at [facility]. Has had knee and hip pain.  Has been treated with steroids.   Hx elbow/wrist fracture after a fall in 2015.    States she has scoliosis.  She is doing blood work with PCP tomorrow.  PCP following up next week.   GYNECOLOGIC HISTORY: No LMP recorded. Patient has had a hysterectomy. Contraception:  postmenopausal/hysterectomy Menopausal hormone therapy:   Last mammogram:  10/16/2021 BI-RADS CATEGORY  1: Negative. Last pap smear:   2000 neg        OB History     Gravida  0   Para  0   Term  0   Preterm  0   AB  0   Living  0      SAB  0   IAB  0   Ectopic  0   Multiple  0   Live Births                 There are no problems to display for this patient.   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     aspirin 81 MG tablet Take 81 mg by mouth daily.     Calcium Carb-Cholecalciferol 600-500 MG-UNIT CAPS Take by mouth. Takes 1/2 tablet daily     Cholecalciferol (VITAMIN D3) 2000 UNITS capsule Take 1 capsule by mouth daily.     glucosamine-chondroitin 500-400 MG tablet Take 1 tablet  by mouth 2 (two) times daily.     levothyroxine (SYNTHROID, LEVOTHROID) 75 MCG tablet Take 1 tablet daily by mouth.     meloxicam (MOBIC) 7.5 MG tablet      Multiple Vitamin (MULTIVITAMIN) capsule Take 1 capsule by mouth daily.     rizatriptan (MAXALT) 10 MG tablet Take 1 tablet by mouth 3 (three) times daily as needed.     simvastatin (ZOCOR) 5 MG tablet Take 1 tablet by mouth daily.     No current facility-administered medications for this visit.     ALLERGIES: Clarithromycin  Family History  Problem Relation Age of Onset   Cancer Mother        pancreatic cancer   Osteoporosis Mother    Dementia Father    Cancer Maternal Aunt        pancreatic cancer    Social History   Socioeconomic History   Marital status: Widowed    Spouse name: Not on file   Number of children:  Not on file   Years of education: Not on file   Highest education level: Not on file  Occupational History   Not on file  Tobacco Use   Smoking status: Never   Smokeless tobacco: Never  Vaping Use   Vaping Use: Never used  Substance and Sexual Activity   Alcohol use: Yes    Alcohol/week: 1.0 standard drink of alcohol    Types: 1 Standard drinks or equivalent per week    Comment: 1drink a month(vodka)   Drug use: No   Sexual activity: Not Currently    Partners: Male    Birth control/protection: Surgical    Comment: TAH  Other Topics Concern   Not on file  Social History Narrative   Not on file   Social Determinants of Health   Financial Resource Strain: Not on file  Food Insecurity: Not on file  Transportation Needs: Not on file  Physical Activity: Not on file  Stress: Not on file  Social Connections: Not on file  Intimate Partner Violence: Not on file    Review of Systems  See HPI.  PHYSICAL EXAMINATION:    BP 110/68 (BP Location: Right Arm, Patient Position: Sitting, Cuff Size: Normal)   Ht '5\' 4"'$  (1.626 m)   Wt 116 lb (52.6 kg)   BMI 19.91 kg/m     General appearance: alert,  cooperative and appears stated age   BMD  Narrative & Impression  EXAM: DUAL X-RAY ABSORPTIOMETRY (DXA) FOR BONE MINERAL DENSITY   IMPRESSION: Referring Physician:  Nunzio Cobbs Your patient completed a bone mineral density test using GE Lunar iDXA system (analysis version: 16). Technologist: [place] PATIENT: Name: Cheryl, Bullock Patient ID:  [id number]   Birth Date: December 10, 1940   Height:     64.0 in. Sex:         Female   Measured:   09/26/2021   Weight:     114.0 lbs. Indications: Advanced Age, Bilateral Ovariectomy (65.51), Caucasian, Estrogen Deficient, Hysterectomy, Postmenopausal, Scoliosis Fractures: Left Forearm Treatments: Calcium (E943.0), Vitamin D (E933.5)   ASSESSMENT: The BMD measured at Forearm Radius 33% is 0.601 g/cm2 with a T-score of -3.1. This patient is considered osteoporotic according to [facility]) criteria.   The quality of the exam is good. L3 was excluded due to degenerative changes.   Site Region Measured Date Measured Age YA BMD Significant CHANGE T-score Left Forearm Radius 33% 09/26/2021 82.2 -3.1 0.601 g/cm2 Left Forearm Radius 33% 09/26/2019 80.2 -2.7 0.641 g/cm2   AP Spine L1-L4 (L3) 09/26/2021 82.2 -0.4 1.106 g/cm2 AP Spine L1-L4 (L3) 09/26/2019 80.2 -0.7 1.084 g/cm2   DualFemur Total Right 09/26/2021 82.2 -2.3 0.718 g/cm2 DualFemur Total Right 09/26/2019 80.2 -2.0 0.751 g/cm2 *   DualFemur Total Mean 09/26/2021 82.2 -2.2 0.735 g/cm2 * DualFemur Total Mean 09/26/2019 80.2 -1.9 0.763 g/cm2 *   World Health Organization [facility]) criteria for post-menopausal, Caucasian Women: Normal       T-score at or above -1 SD Osteopenia   T-score between -1 and -2.5 SD Osteoporosis T-score at or below -2.5 SD   RECOMMENDATION: 1. All patients should optimize calcium and vitamin D intake. 2. Consider FDA-approved medical therapies in postmenopausal women and men aged 54 years and older, based on the following: a. A  hip or vertebral (clinical or morphometric) fracture. b. T-score = -2.5 at the femoral neck or spine after appropriate evaluation to exclude secondary causes. c. Low bone mass (T-score between -1.0 and -2.5 at  the femoral neck or spine) and a 10-year probability of a hip fracture = 3% or a 10-year probability of a major osteoporosis-related fracture = 20% based on the [place]-adapted WHO algorithm. d. Clinician judgment and/or patient preferences may indicate treatment for people with 10-year fracture probabilities above or below these levels.   FOLLOW-UP: Patients with diagnosis of osteoporosis or at high risk for fracture should have regular bone mineral density tests. Patients eligible for Medicare are allowed routine testing every 2 years. The testing frequency can be increased to one year for patients who have rapidly progressing disease, are receiving or discontinuing medical therapy to restore bone mass, or have additional risk factors.   I have reviewed this study and agree with the findings. [facility]     Electronically Signed   By: Ammie Ferrier M.D.   On: 09/26/2021 11:00       ASSESSMENT  Osteoporosis of forearm, osteopenia of hip. Hx fracture right elbow and wrist 2015.  After a fall.  Scoliosis.   PLAN  Bone density report discussed.  Counseled regarding osteoporosis, risk factors, treatment options (bisphosphonates, Evista, Prolia, Forteo), and fall risk reduction. Risks and benefits of osteoporosis medications reviewed.  Patient would like to consider choices.  Reading materials provided. Discussed importance of weight bearing exercise and daily calcium 1200 mg daily and vit D.  Will check PTH, phosphorus, vit D level, hpefully when she sees her PCP for her blood work.  If not able to do then, will do in the lab here.  Patient does understand that osteoporosis is a silent disease until fracture occurs and that there is significant morbidity  associated with osteoporotic fracture.  She will also discuss her osteoporosis with her PCP.  FU her prn.   An After Visit Summary was printed and given to the patient.  39 min  total time was spent for this patient encounter, including preparation, face-to-face counseling with the patient, coordination of care, and documentation of the encounter.

## 2021-10-21 DIAGNOSIS — R7989 Other specified abnormal findings of blood chemistry: Secondary | ICD-10-CM | POA: Diagnosis not present

## 2021-10-21 DIAGNOSIS — E039 Hypothyroidism, unspecified: Secondary | ICD-10-CM | POA: Diagnosis not present

## 2021-10-21 DIAGNOSIS — E7800 Pure hypercholesterolemia, unspecified: Secondary | ICD-10-CM | POA: Diagnosis not present

## 2021-10-22 ENCOUNTER — Encounter: Payer: Self-pay | Admitting: Obstetrics and Gynecology

## 2021-10-22 DIAGNOSIS — Z1212 Encounter for screening for malignant neoplasm of rectum: Secondary | ICD-10-CM | POA: Diagnosis not present

## 2021-10-24 NOTE — Telephone Encounter (Signed)
Please send a written order to Dr. Doree Barthel office for a parathyroid hormone and phosphorus for my patient.  Her diagnosis is osteoporosis.   If this is not possible to add to blood already drawn at her PCP office, she will need to return to our office to have a lab visit.

## 2021-10-25 ENCOUNTER — Encounter: Payer: Self-pay | Admitting: Obstetrics and Gynecology

## 2021-10-25 DIAGNOSIS — M810 Age-related osteoporosis without current pathological fracture: Secondary | ICD-10-CM | POA: Insufficient documentation

## 2021-10-25 NOTE — Patient Instructions (Addendum)
Denosumab Injection (Osteoporosis) What is this medication? DENOSUMAB (den oh SUE mab) prevents and treats osteoporosis. It works by Paramedic stronger and less likely to break (fracture). It is a monoclonal antibody. This medicine may be used for other purposes; ask your health care provider or pharmacist if you have questions. COMMON BRAND NAME(S): Prolia What should I tell my care team before I take this medication? They need to know if you have any of these conditions: Dental or gum disease, or plan to have dental surgery or a tooth pulled Infection Kidney disease Low levels of calcium or vitamin D in your blood On dialysis Poor nutrition Skin conditions Thyroid disease, or have had thyroid or parathyroid surgery Trouble absorbing minerals in your stomach or intestine An unusual reaction to denosumab, other medications, foods, dyes, or preservatives Pregnant or trying to get pregnant Breast-feeding How should I use this medication? This medication is injected under the skin. It is given by your care team in a hospital or clinic setting. A special MedGuide will be given to you before each treatment. Be sure to read this information carefully each time. Talk to your care team about the use of this medication in children. Special care may be needed. Overdosage: If you think you have taken too much of this medicine contact a poison control center or emergency room at once. NOTE: This medicine is only for you. Do not share this medicine with others. What if I miss a dose? Keep appointments for follow-up doses. It is important not to miss your dose. Call your care team if you are unable to keep an appointment. What may interact with this medication? Do not take this medication with any of the following: Other medications that contain denosumab This medication may also interact with the following: Medications that lower your chance of fighting infection Steroid medications, such  as prednisone or cortisone This list may not describe all possible interactions. Give your health care provider a list of all the medicines, herbs, non-prescription drugs, or dietary supplements you use. Also tell them if you smoke, drink alcohol, or use illegal drugs. Some items may interact with your medicine. What should I watch for while using this medication? Your condition will be monitored carefully while you are receiving this medication. You may need blood work while taking this medication. This medication may increase your risk of getting an infection. Call your care team for advice if you get a fever, chills, sore throat, or other symptoms of a cold or flu. Do not treat yourself. Try to avoid being around people who are sick. Tell your dentist and dental surgeon that you are taking this medication. You should not have major dental surgery while on this medication. See your dentist to have a dental exam and fix any dental problems before starting this medication. Take good care of your teeth while on this medication. Make sure you see your dentist for regular follow-up appointments. You should make sure you get enough calcium and vitamin D while you are taking this medication. Discuss the foods you eat and the vitamins you take with your care team. Talk to your care team if you are pregnant or think you might be pregnant. This medication can cause serious birth defects if taken during pregnancy and for 5 months after the last dose. You will need a negative pregnancy test before starting this medication. Contraception is recommended while taking this medication and for 5 months after the last dose. Your care team can  help you find the option that works for you. Talk to your care team before breastfeeding. Changes to your treatment plan may be needed. What side effects may I notice from receiving this medication? Side effects that you should report to your care team as soon as possible: Allergic  reactions--skin rash, itching, hives, swelling of the face, lips, tongue, or throat Infection--fever, chills, cough, sore throat, wounds that don't heal, pain or trouble when passing urine, general feeling of discomfort or being unwell Low calcium level--muscle pain or cramps, confusion, tingling, or numbness in the hands or feet Osteonecrosis of the jaw--pain, swelling, or redness in the mouth, numbness of the jaw, poor healing after dental work, unusual discharge from the mouth, visible bones in the mouth Severe bone, joint, or muscle pain Skin infection--skin redness, swelling, warmth, or pain Side effects that usually do not require medical attention (report these to your care team if they continue or are bothersome): Back pain Headache Joint pain Muscle pain Pain in the hands, arms, legs, or feet Runny or stuffy nose Sore throat This list may not describe all possible side effects. Call your doctor for medical advice about side effects. You may report side effects to FDA at 1-800-FDA-1088. Where should I keep my medication? This medication is given in a hospital or clinic. It will not be stored at home. NOTE: This sheet is a summary. It may not cover all possible information. If you have questions about this medicine, talk to your doctor, pharmacist, or health care provider.  2023 Elsevier/Gold Standard (2021-05-19 00:00:00)   Zoledronic Acid Injection (Bone Disorders) What is this medication? ZOLEDRONIC ACID (ZOE le dron ik AS id) prevents and treats osteoporosis. It may also be used to treat Paget's disease of the bone. It works by Paramedic stronger and less likely to break (fracture). It belongs to a group of medications called bisphosphonates. This medicine may be used for other purposes; ask your health care provider or pharmacist if you have questions. COMMON BRAND NAME(S): Reclast What should I tell my care team before I take this medication? They need to know if you  have any of these conditions: Bleeding disorder Cancer Dental disease Kidney disease Low levels of calcium in the blood Low red blood cell counts Lung or breathing disease, such as asthma Receiving steroids, such as dexamethasone or prednisone An unusual or allergic reaction to zoledronic acid, other medications, foods, dyes, or preservatives Pregnant or trying to get pregnant Breast-feeding How should I use this medication? This medication is injected into a vein. It is given by your care team in a hospital or clinic setting. A special MedGuide will be given to you before each treatment. Be sure to read this information carefully each time. Talk to your care team about the use of this medication in children. Special care may be needed. Overdosage: If you think you have taken too much of this medicine contact a poison control center or emergency room at once. NOTE: This medicine is only for you. Do not share this medicine with others. What if I miss a dose? Keep appointments for follow-up doses. It is important not to miss your dose. Call your care team if you are unable to keep an appointment. What may interact with this medication? Certain antibiotics given by injection Medications for pain and inflammation, such as ibuprofen, naproxen, NSAIDs Some diuretics, such as bumetanide, furosemide Teriparatide This list may not describe all possible interactions. Give your health care provider a list of  all the medicines, herbs, non-prescription drugs, or dietary supplements you use. Also tell them if you smoke, drink alcohol, or use illegal drugs. Some items may interact with your medicine. What should I watch for while using this medication? Visit your care team for regular checks on your progress. It may be some time before you see the benefit from this medication. Some people who take this medication have severe bone, joint, or muscle pain. This medication may also increase your risk for  jaw problems or a broken thigh bone. Tell your care team right away if you have severe pain in your jaw, bones, joints, or muscles. Tell your care team if you have any pain that does not go away or that gets worse. You should make sure you get enough calcium and vitamin D while you are taking this medication. Discuss the foods you eat and the vitamins you take with your care team. You may need bloodwork while taking this medication. Tell your dentist and dental surgeon that you are taking this medication. You should not have major dental surgery while on this medication. See your dentist to have a dental exam and fix any dental problems before starting this medication. Take good care of your teeth while on this medication. Make sure you see your dentist for regular follow-up appointments. What side effects may I notice from receiving this medication? Side effects that you should report to your care team as soon as possible: Allergic reactions--skin rash, itching, hives, swelling of the face, lips, tongue, or throat Kidney injury--decrease in the amount of urine, swelling of the ankles, hands, or feet Low calcium level--muscle pain or cramps, confusion, tingling, or numbness in the hands or feet Osteonecrosis of the jaw--pain, swelling, or redness in the mouth, numbness of the jaw, poor healing after dental work, unusual discharge from the mouth, visible bones in the mouth Severe bone, joint, or muscle pain Side effects that usually do not require medical attention (report to your care team if they continue or are bothersome): Diarrhea Dizziness Headache Nausea Stomach pain Vomiting This list may not describe all possible side effects. Call your doctor for medical advice about side effects. You may report side effects to FDA at 1-800-FDA-1088. Where should I keep my medication? This medication is given in a hospital or clinic. It will not be stored at home. NOTE: This sheet is a summary. It may  not cover all possible information. If you have questions about this medicine, talk to your doctor, pharmacist, or health care provider.  2023 Elsevier/Gold Standard (2007-02-26 00:00:00)   Risedronate Tablets What is this medication? RISEDRONATE (ris ED roe nate) prevents and treats osteoporosis. It may also be used to treat Paget's disease of the bone. It works by Paramedic stronger and less likely to break (fracture). It belongs to a group of medications called bisphosphonates. This medicine may be used for other purposes; ask your health care provider or pharmacist if you have questions. COMMON BRAND NAME(S): Actonel What should I tell my care team before I take this medication? They need to know if you have any of these conditions: Bleeding disorder Cancer Dental disease Difficulty swallowing Infection Kidney disease Low levels of calcium in the blood Low red blood cell levels Stomach or intestine problems Taking steroids, such as dexamethasone or prednisone Trouble sitting or standing for 30 minutes An unusual or allergic reaction to risedronate, other medications, foods, dyes, or preservatives Pregnant or trying to get pregnant Breast-feeding How should I use  this medication? Take this medication by mouth with a full glass of water. Take it as directed on the prescription label. If you take it once a day, take it at the same time every day. If you take it once a month, take it on the same day of each month. Take the dose right after waking up. Do not eat or drink anything before taking it. Do not take it with any other drink except water. Do not chew or crush the tablet. After taking it, do not eat breakfast, drink, or take any other medications or vitamins for at least 30 minutes. Sit or stand up for at least 30 minutes after you take it. Do not lie down. Keep taking it unless your care team tells you to stop. A special MedGuide will be given to you by the pharmacist with  each prescription and refill. Be sure to read this information carefully each time. Talk to your care team about the use of this medication in children. Special care may be needed. Overdosage: If you think you have taken too much of this medicine contact a poison control center or emergency room at once. NOTE: This medicine is only for you. Do not share this medicine with others. What if I miss a dose? If you take your medication once a day, skip it. Take your next dose at the scheduled time the next morning. Do not take two doses on the same day. If you take your medication once a month and the next scheduled dose is more than 7 days away, take the missed dose on the morning after you remember. Do not take two doses on the same day. If you take your medication once a month and the next scheduled dose is only 1 to 7 days away, skip it. Take the next dose on the morning of the next scheduled dose. Do not take two doses on the same day. What may interact with this medication? Aluminum hydroxide Antacids Aspirin Calcium supplements Iron supplements Magnesium supplements NSAIDS, medications for pain and inflammation, such as ibuprofen or naproxen Stomach acid blockers, such as cimetidine, famotidine, omeprazole Vitamins with minerals This list may not describe all possible interactions. Give your health care provider a list of all the medicines, herbs, non-prescription drugs, or dietary supplements you use. Also tell them if you smoke, drink alcohol, or use illegal drugs. Some items may interact with your medicine. What should I watch for while using this medication? Visit your care team for regular checks on your progress. It may be some time before you see the benefit from this medication. Some people who take this medication have severe bone, joint, or muscle pain. This medication may also increase your risk for jaw problems or a broken thigh bone. Tell your care team right away if you have  severe pain in your jaw, bones, joints, or muscles. Tell your care team if you have any pain that does not go away or that gets worse. You should make sure you get enough calcium and vitamin D while you are taking this medication. Discuss the foods you eat and the vitamins you take with your care team. Tell your dentist and dental surgeon that you are taking this medication. You should not have major dental surgery while on this medication. See your dentist to have a dental exam and fix any dental problems before starting this medication. Take good care of your teeth while on this medication. Make sure you see your dentist for regular  follow-up appointments. What side effects may I notice from receiving this medication? Side effects that you should report to your care team as soon as possible: Allergic reactions--skin rash, itching, hives, swelling of the face, lips, tongue, or throat Low calcium level--muscle pain or cramps, confusion, tingling, or numbness in the hands or feet Osteonecrosis of the jaw--pain, swelling, or redness in the mouth, numbness of the jaw, poor healing after dental work, unusual discharge from the mouth, visible bones in the mouth Pain or trouble swallowing Severe bone, joint, or muscle pain Stomach bleeding--bloody or black, tar-like stools, vomiting blood or brown material that looks like coffee grounds Side effects that usually do not require medical attention (report to your care team if they continue or are bothersome): Back pain Joint pain Stomach pain Upset stomach This list may not describe all possible side effects. Call your doctor for medical advice about side effects. You may report side effects to FDA at 1-800-FDA-1088. Where should I keep my medication? Keep out of the reach of children and pets. Store at room temperature between 20 and 25 degrees C (68 and 77 degrees F). Get rid of any unused medication after the expiration date. To get rid of medications  that are no longer needed or have expired: Take the medication to a take-back program. Check with your pharmacy or law enforcement to find a location. If you cannot return the medication, check the label or package insert to see if the medication should be thrown out in the garbage or flushed down the toilet. If you are not sure, ask your care team. If it is safe to put it in the trash, empty the medication out of the container. Mix the medication with cat litter, dirt, coffee grounds, or other unwanted substance. Seal the mixture in a bag or container. Put it in the trash. NOTE: This sheet is a summary. It may not cover all possible information. If you have questions about this medicine, talk to your doctor, pharmacist, or health care provider.  2023 Elsevier/Gold Standard (2021-04-03 00:00:00)   Alendronate Tablets What is this medication? ALENDRONATE (a LEN droe nate) prevents and treats osteoporosis. It may also be used to treat Paget disease of the bone. It works by Paramedic stronger and less likely to break (fracture). It belongs to a group of medications called bisphosphonates. This medicine may be used for other purposes; ask your health care provider or pharmacist if you have questions. COMMON BRAND NAME(S): Fosamax What should I tell my care team before I take this medication? They need to know if you have any of these conditions: Bleeding disorder Cancer Dental disease Difficulty swallowing Infection (fever, chills, cough, sore throat, pain or trouble passing urine) Kidney disease Low levels of calcium or other minerals in the blood Low red blood cell counts Receiving steroids like dexamethasone or prednisone Stomach or intestine problems Trouble sitting or standing for 30 minutes An unusual or allergic reaction to alendronate, other medications, foods, dyes or preservatives Pregnant or trying to get pregnant Breast-feeding How should I use this medication? Take this  medication by mouth with a full glass of water. Take it as directed on the prescription label at the same time every day. Take the dose right after waking up. Do not eat or drink anything before taking it. Do not take it with any other drink except water. Do not chew or crush the tablet. After taking it, do not eat breakfast, drink, or take any other medications or  vitamins for at least 30 minutes. Sit or stand up for at least 30 minutes after you take it. Do not lie down. Keep taking it unless your care team tells you to stop. A special MedGuide will be given to you by the pharmacist with each prescription and refill. Be sure to read this information carefully each time. Talk to your care team about the use of this medication in children. Special care may be needed. Overdosage: If you think you have taken too much of this medicine contact a poison control center or emergency room at once. NOTE: This medicine is only for you. Do not share this medicine with others. What if I miss a dose? If you take your medication once a day, skip it. Take your next dose at the scheduled time the next morning. Do not take two doses on the same day. If you take your medication once a week, take the missed dose on the morning after you remember. Do not take two doses on the same day. What may interact with this medication? Aluminum hydroxide Antacids Aspirin Calcium supplements Medications for inflammation like ibuprofen, naproxen, and others Iron supplements Magnesium supplements Vitamins with minerals This list may not describe all possible interactions. Give your health care provider a list of all the medicines, herbs, non-prescription drugs, or dietary supplements you use. Also tell them if you smoke, drink alcohol, or use illegal drugs. Some items may interact with your medicine. What should I watch for while using this medication? Visit your care team for regular checks on your progress. It may be some time  before you see the benefit from this medication. Some people who take this medication have severe bone, joint, or muscle pain. This medication may also increase your risk for jaw problems or a broken thigh bone. Tell your care team right away if you have severe pain in your jaw, bones, joints, or muscles. Tell you care team if you have any pain that does not go away or that gets worse. Tell your dentist and dental surgeon that you are taking this medication. You should not have major dental surgery while on this medication. See your dentist to have a dental exam and fix any dental problems before starting this medication. Take good care of your teeth while on this medication. Make sure you see your dentist for regular follow-up appointments. You should make sure you get enough calcium and vitamin D while you are taking this medication. Discuss the foods you eat and the vitamins you take with your care team. You may need blood work done while you are taking this medication. What side effects may I notice from receiving this medication? Side effects that you should report to your care team as soon as possible: Allergic reactions--skin rash, itching, hives, swelling of the face, lips, tongue, or throat Low calcium level--muscle pain or cramps, confusion, tingling, or numbness in the hands or feet Osteonecrosis of the jaw--pain, swelling, or redness in the mouth, numbness of the jaw, poor healing after dental work, unusual discharge from the mouth, visible bones in the mouth Pain or trouble swallowing Severe bone, joint, or muscle pain Stomach bleeding--bloody or black, tar-like stools, vomiting blood or brown material that looks like coffee grounds Side effects that usually do not require medical attention (report to your care team if they continue or are bothersome): Constipation Diarrhea Nausea Stomach pain This list may not describe all possible side effects. Call your doctor for medical advice  about side effects.  You may report side effects to FDA at 1-800-FDA-1088. Where should I keep my medication? Keep out of the reach of children and pets. Store at room temperature between 15 and 30 degrees C (59 and 86 degrees F). Throw away any unused medication after the expiration date. NOTE: This sheet is a summary. It may not cover all possible information. If you have questions about this medicine, talk to your doctor, pharmacist, or health care provider.  2023 Elsevier/Gold Standard (2007-02-26 00:00:00)   Osteoporosis  Osteoporosis happens when the bones become thin and less dense than normal. Osteoporosis makes bones more brittle and fragile and more likely to break (fracture). Over time, osteoporosis can cause your bones to become so weak that they fracture after a minor fall. Bones in the hip, wrist, and spine are most likely to fracture due to osteoporosis. What are the causes? The exact cause of this condition is not known. What increases the risk? You are more likely to develop this condition if you: Have family members with this condition. Have poor nutrition. Use the following: Steroid medicines, such as prednisone. Anti-seizure medicines. Nicotine or tobacco, such as cigarettes, e-cigarettes, and chewing tobacco. Are female. Are age 63 or older. Are not physically active (are sedentary). Are of European or Asian descent. Have a small body frame. What are the signs or symptoms? A fracture might be the first sign of osteoporosis, especially if the fracture results from a fall or injury that usually would not cause a bone to break. Other signs and symptoms include: Pain in the neck or low back. Stooped posture. Loss of height. How is this diagnosed? This condition may be diagnosed based on: Your medical history. A physical exam. A bone mineral density test, also called a DXA or DEXA test (dual-energy X-ray absorptiometry test). This test uses X-rays to measure the  amount of minerals in your bones. How is this treated? This condition may be treated by: Making lifestyle changes, such as: Including foods with more calcium and vitamin D in your diet. Doing weight-bearing and muscle-strengthening exercises. Stopping tobacco use. Limiting alcohol intake. Taking medicine to slow the process of bone loss or to increase bone density. Taking daily supplements of calcium and vitamin D. Taking hormone replacement medicines, such as estrogen for women and testosterone for men. Monitoring your levels of calcium and vitamin D. The goal of treatment is to strengthen your bones and lower your risk for a fracture. Follow these instructions at home: Eating and drinking Include calcium and vitamin D in your diet. Calcium is important for bone health, and vitamin D helps your body absorb calcium. Good sources of calcium and vitamin D include: Certain fatty fish, such as salmon and tuna. Products that have calcium and vitamin D added to them (are fortified), such as fortified cereals. Egg yolks. Cheese. Liver.  Activity Do exercises as told by your health care provider. Ask your health care provider what exercises and activities are safe for you. You should do: Exercises that make you work against gravity (weight-bearing exercises), such as tai chi, yoga, or walking. Exercises to strengthen muscles, such as lifting weights. Lifestyle Do not drink alcohol if: Your health care provider tells you not to drink. You are pregnant, may be pregnant, or are planning to become pregnant. If you drink alcohol: Limit how much you use to: 0-1 drink a day for women. 0-2 drinks a day for men. Know how much alcohol is in your drink. In the [place] one drink equals one  12 oz bottle of beer (355 mL), one 5 oz glass of wine (148 mL), or one 1 oz glass of hard liquor (44 mL). Do not use any products that contain nicotine or tobacco, such as cigarettes, e-cigarettes, and chewing  tobacco. If you need help quitting, ask your health care provider. Preventing falls Use devices to help you move around (mobility aids) as needed, such as canes, walkers, scooters, or crutches. Keep rooms well-lit and clutter-free. Remove tripping hazards from walkways, including cords and throw rugs. Install grab bars in bathrooms and safety rails on stairs. Use rubber mats in the bathroom and other areas that are often wet or slippery. Wear closed-toe shoes that fit well and support your feet. Wear shoes that have rubber soles or low heels. Review your medicines with your health care provider. Some medicines can cause dizziness or changes in blood pressure, which can increase your risk of falling. General instructions Take over-the-counter and prescription medicines only as told by your health care provider. Keep all follow-up visits. This is important. Contact a health care provider if: You have never been screened for osteoporosis and you are: A woman who is age 77 or older. A man who is age 39 or older. Get help right away if: You fall or injure yourself. Summary Osteoporosis is thinning and loss of density in your bones. This makes bones more brittle and fragile and more likely to break (fracture),even with minor falls. The goal of treatment is to strengthen your bones and lower your risk for a fracture. Include calcium and vitamin D in your diet. Calcium is important for bone health, and vitamin D helps your body absorb calcium. Talk with your health care provider about screening for osteoporosis if you are a woman who is age 70 or older, or a man who is age 64 or older. This information is not intended to replace advice given to you by your health care provider. Make sure you discuss any questions you have with your health care provider. Document Revised: 06/22/2019 Document Reviewed: 06/22/2019 Elsevier Patient Education  [place].

## 2021-10-26 IMAGING — MG MM DIGITAL SCREENING BILAT W/ TOMO AND CAD
8 series · 9 of 24 positions shown · non-contrast
Comparison: Previous exam(s).

CLINICAL DATA: Screening.

EXAM:
DIGITAL SCREENING BILATERAL MAMMOGRAM WITH TOMOSYNTHESIS AND CAD
TECHNIQUE: Bilateral screening digital craniocaudal and mediolateral oblique
mammograms were obtained. Bilateral screening digital breast
tomosynthesis was performed. The images were evaluated with
computer-aided detection.

[L MLO synth-2D]
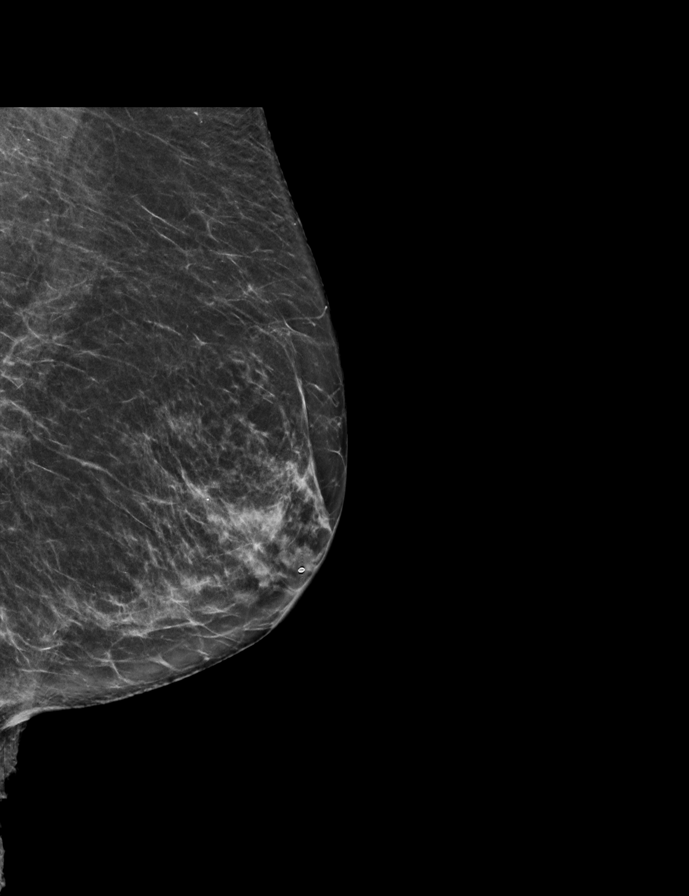

[R MLO synth-2D]
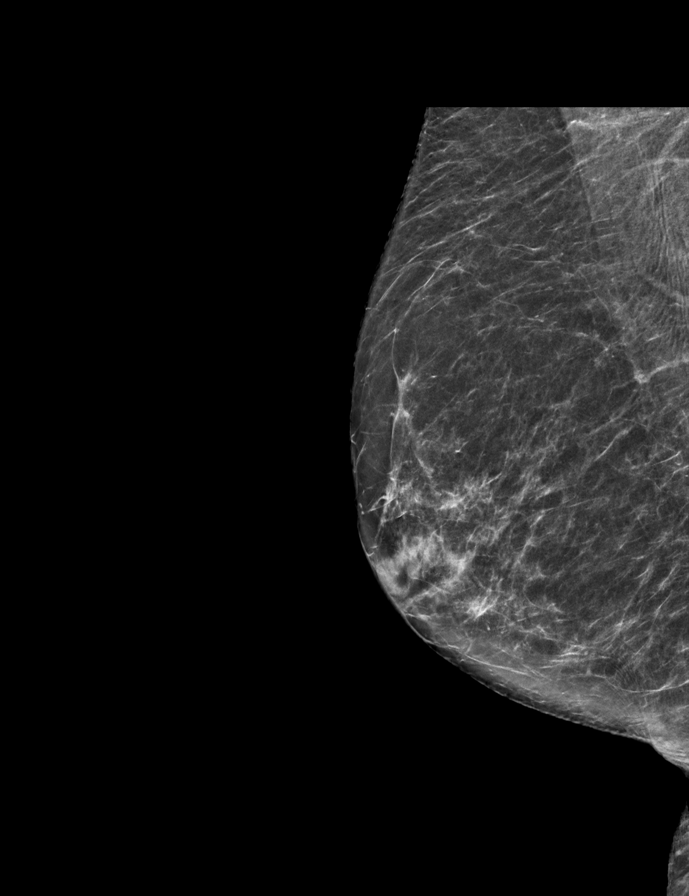

[R CC synth-2D]
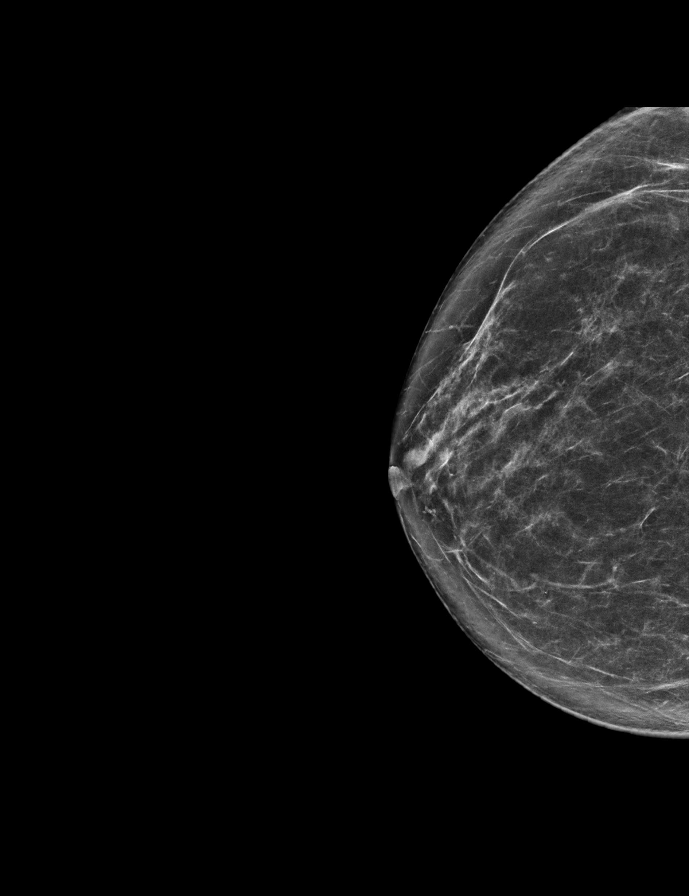

[L CC synth-2D]
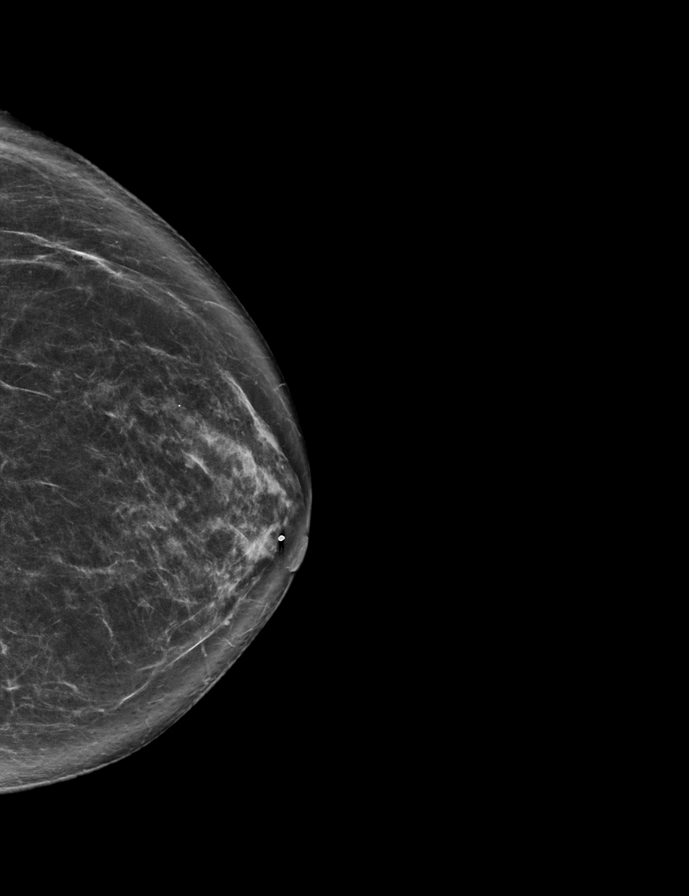

[L CC tomo · 2 of 70 frames shown]
[frame 23/70]
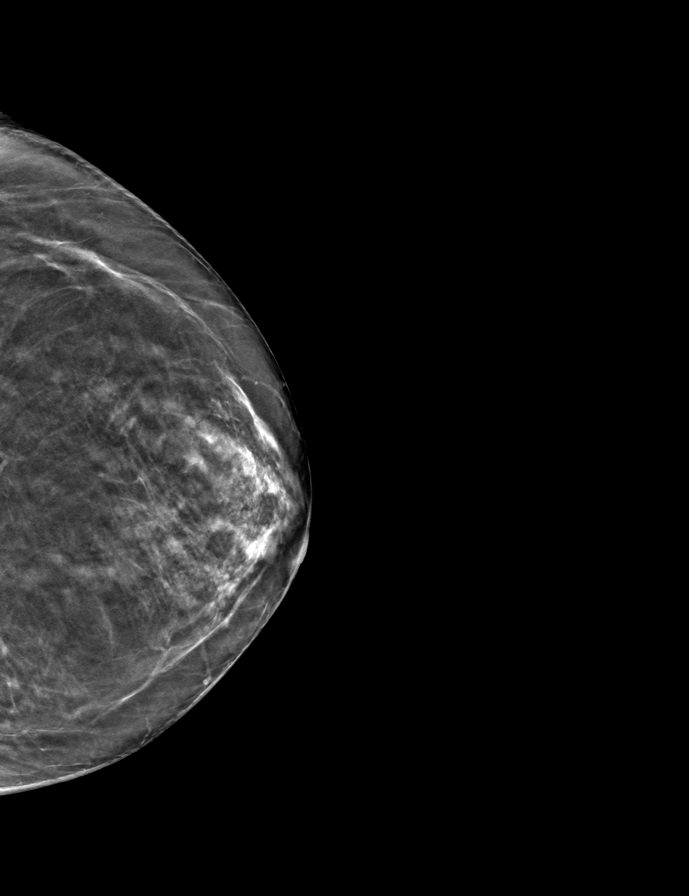
[frame 35/70]
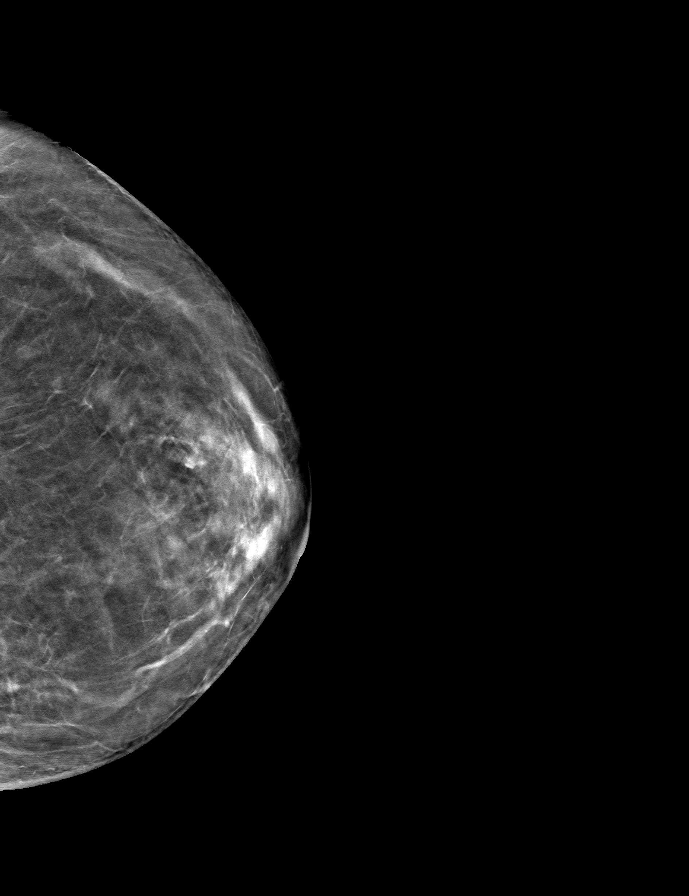

[R CC tomo · tomo slice 35/69.0]
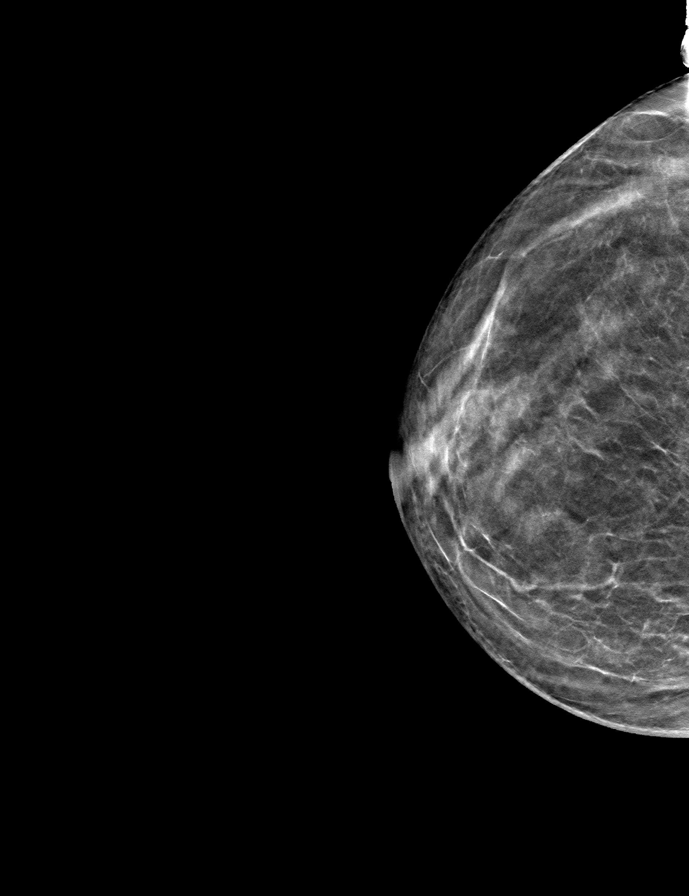

[R MLO tomo · tomo slice 33/66.0]
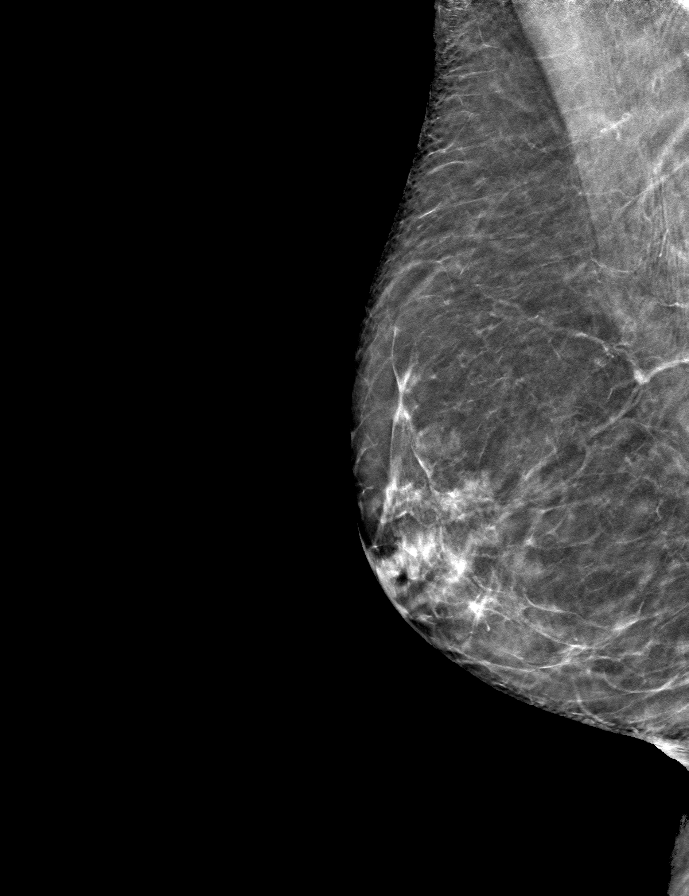

[L MLO tomo · tomo slice 32/63.0]
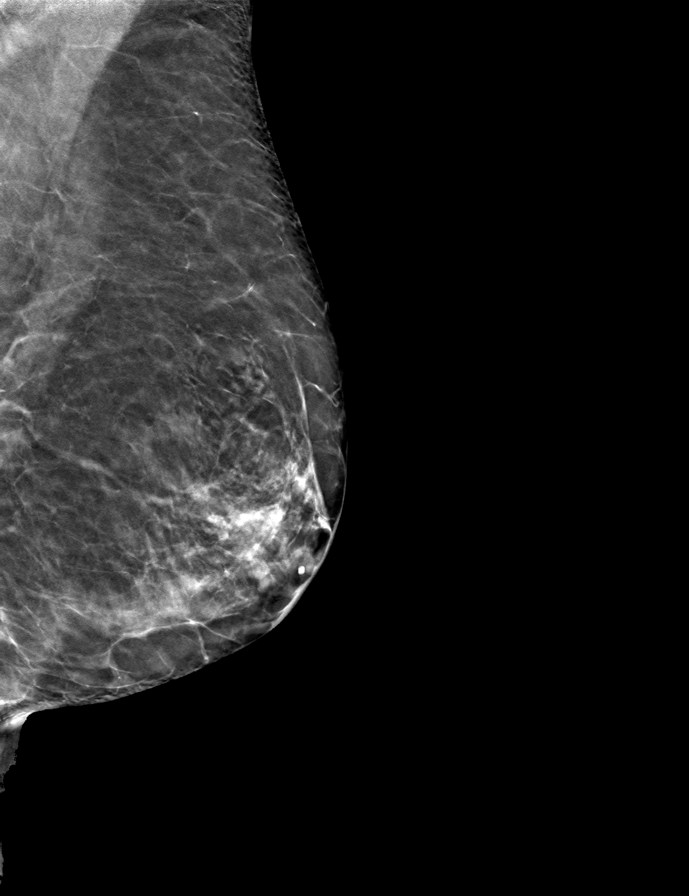

[9 of 24 positions shown; findings below may reference images not displayed]

ACR Breast Density Category c: The breast tissue is heterogeneously
dense, which may obscure small masses.
FINDINGS: There are no findings suspicious for malignancy.
IMPRESSION: No mammographic evidence of malignancy. A result letter of this
screening mammogram will be mailed directly to the patient.

RECOMMENDATION:
Screening mammogram in one year. (Code:Q3-W-BC3)

BI-RADS CATEGORY  1: Negative.

## 2021-10-28 DIAGNOSIS — M1612 Unilateral primary osteoarthritis, left hip: Secondary | ICD-10-CM | POA: Diagnosis not present

## 2021-10-28 DIAGNOSIS — Z1331 Encounter for screening for depression: Secondary | ICD-10-CM | POA: Diagnosis not present

## 2021-10-28 DIAGNOSIS — M8580 Other specified disorders of bone density and structure, unspecified site: Secondary | ICD-10-CM | POA: Diagnosis not present

## 2021-10-28 DIAGNOSIS — E7800 Pure hypercholesterolemia, unspecified: Secondary | ICD-10-CM | POA: Diagnosis not present

## 2021-10-28 DIAGNOSIS — Z1339 Encounter for screening examination for other mental health and behavioral disorders: Secondary | ICD-10-CM | POA: Diagnosis not present

## 2021-10-28 DIAGNOSIS — Z23 Encounter for immunization: Secondary | ICD-10-CM | POA: Diagnosis not present

## 2021-10-28 DIAGNOSIS — E039 Hypothyroidism, unspecified: Secondary | ICD-10-CM | POA: Diagnosis not present

## 2021-10-28 DIAGNOSIS — Z0000 Encounter for general adult medical examination without abnormal findings: Secondary | ICD-10-CM | POA: Diagnosis not present

## 2021-10-28 DIAGNOSIS — G43009 Migraine without aura, not intractable, without status migrainosus: Secondary | ICD-10-CM | POA: Diagnosis not present

## 2021-11-22 ENCOUNTER — Encounter: Payer: Self-pay | Admitting: Obstetrics and Gynecology

## 2021-11-25 NOTE — Telephone Encounter (Signed)
Needs visit to discuss dexa.

## 2021-11-25 NOTE — Telephone Encounter (Signed)
Patient scheduled 12/01/21

## 2021-12-01 ENCOUNTER — Encounter: Payer: Self-pay | Admitting: Obstetrics and Gynecology

## 2021-12-01 ENCOUNTER — Ambulatory Visit: Payer: Medicare PPO | Admitting: Obstetrics and Gynecology

## 2021-12-01 VITALS — BP 108/70 | HR 68 | Ht 64.0 in | Wt 117.0 lb

## 2021-12-01 DIAGNOSIS — M810 Age-related osteoporosis without current pathological fracture: Secondary | ICD-10-CM

## 2021-12-01 NOTE — Progress Notes (Signed)
GYNECOLOGY  VISIT   HPI: 81 y.o.   Widowed  Caucasian  female   G0P0000 with No LMP recorded. Patient has had a hysterectomy.   here for discussing dexa  She has hx of fracture of elbow/wrist in 2015.   We reviewed results from 2021 bone density as well.   Normal PTH, calcium, vit D, creatinine, and phosphorus.   Hx reflux and used Nexium in the past.   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  Has done sessions at [facility].   She has considering seeing an endocrinologist at [facility].  Her friend saw a specialist there.   Narrative & Impression  EXAM: DUAL X-RAY ABSORPTIOMETRY (DXA) FOR BONE MINERAL DENSITY   IMPRESSION: Referring Physician:  Nunzio Cobbs Your patient completed a bone mineral density test using GE Lunar iDXA system (analysis version: 16). Technologist: Nicasio PATIENT: Name: Cheryl Bullock, Cheryl Bullock Patient ID:  [id number]   Birth Date: Aug 04, 1940   Height:     64.0 in. Sex:         Female   Measured:   09/26/2021   Weight:     114.0 lbs. Indications: Advanced Age, Bilateral Ovariectomy (65.51), Caucasian, Estrogen Deficient, Hysterectomy, Postmenopausal, Scoliosis Fractures: Left Forearm Treatments: Calcium (E943.0), Vitamin D (E933.5)   ASSESSMENT: The BMD measured at Forearm Radius 33% is 0.601 g/cm2 with a T-score of -3.1. This patient is considered osteoporotic according to [facility]) criteria.   The quality of the exam is good. L3 was excluded due to degenerative changes.   Site Region Measured Date Measured Age YA BMD Significant CHANGE T-score Left Forearm Radius 33% 09/26/2021 82.2 -3.1 0.601 g/cm2 Left Forearm Radius 33% 09/26/2019 80.2 -2.7 0.641 g/cm2   AP Spine L1-L4 (L3) 09/26/2021 82.2 -0.4 1.106 g/cm2 AP Spine L1-L4 (L3) 09/26/2019 80.2 -0.7 1.084 g/cm2   DualFemur Total Right 09/26/2021 82.2 -2.3 0.718 g/cm2 DualFemur Total Right 09/26/2019 80.2 -2.0 0.751 g/cm2 *   DualFemur Total Mean 09/26/2021  82.2 -2.2 0.735 g/cm2 * DualFemur Total Mean 09/26/2019 80.2 -1.9 0.763 g/cm2 *   World Health Organization [facility]) criteria for post-menopausal, Caucasian Women: Normal       T-score at or above -1 SD Osteopenia   T-score between -1 and -2.5 SD Osteoporosis T-score at or below -2.5 SD   RECOMMENDATION: 1. All patients should optimize calcium and vitamin D intake. 2. Consider FDA-approved medical therapies in postmenopausal women and men aged 65 years and older, based on the following: a. A hip or vertebral (clinical or morphometric) fracture. b. T-score = -2.5 at the femoral neck or spine after appropriate evaluation to exclude secondary causes. c. Low bone mass (T-score between -1.0 and -2.5 at the femoral neck or spine) and a 10-year probability of a hip fracture = 3% or a 10-year probability of a major osteoporosis-related fracture = 20% based on the [place]-adapted WHO algorithm. d. Clinician judgment and/or patient preferences may indicate treatment for people with 10-year fracture probabilities above or below these levels.   FOLLOW-UP: Patients with diagnosis of osteoporosis or at high risk for fracture should have regular bone mineral density tests. Patients eligible for Medicare are allowed routine testing every 2 years. The testing frequency can be increased to one year for patients who have rapidly progressing disease, are receiving or discontinuing medical therapy to restore bone mass, or have additional risk factors.   I have reviewed this study and agree with the findings. [facility]     Electronically Signed  By: Ammie Ferrier M.D.   On: 09/26/2021 11:00    GYNECOLOGIC HISTORY: No LMP recorded. Patient has had a hysterectomy. 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     aspirin 81 MG tablet Take 81 mg by mouth daily.     Calcium Carb-Cholecalciferol 600-500 MG-UNIT CAPS Take by mouth. Takes 1/2 tablet daily     Cholecalciferol (VITAMIN D3) 2000 UNITS capsule Take 1 capsule by mouth daily.     glucosamine-chondroitin 500-400 MG tablet Take 1 tablet by mouth 2 (two) times daily.     levothyroxine (SYNTHROID, LEVOTHROID) 75 MCG tablet Take 1 tablet daily by mouth.     meloxicam (MOBIC) 7.5 MG tablet      Multiple Vitamin (MULTIVITAMIN) capsule Take 1 capsule by mouth daily.     rizatriptan (MAXALT) 10 MG tablet Take 1 tablet by mouth 3 (three) times daily as needed.     simvastatin (ZOCOR) 5 MG tablet Take 1 tablet by mouth daily.     No current facility-administered medications for this visit.     ALLERGIES: Clarithromycin  Family History  Problem Relation Age of Onset   Cancer Mother        pancreatic cancer   Osteoporosis Mother    Dementia Father    Cancer Maternal Aunt        pancreatic cancer    Social History   Socioeconomic History   Marital status: Widowed    Spouse name: Not on file   Number of children: Not on file   Years of education:  Not on file   Highest education level: Not on file  Occupational History   Not on file  Tobacco Use   Smoking status: Never   Smokeless tobacco: Never  Vaping Use   Vaping Use: Never used  Substance and Sexual Activity   Alcohol use: Yes    Alcohol/week: 1.0 standard drink of alcohol    Types: 1 Standard drinks or equivalent per week    Comment: 1drink a month(vodka)   Drug use: No   Sexual activity: Not Currently    Partners: Male  Birth control/protection: Surgical    Comment: TAH  Other Topics Concern   Not on file  Social History Narrative   Not on file   Social Determinants of Health   Financial Resource Strain: Not on file  Food Insecurity: Not on file  Transportation Needs: Not on file  Physical Activity: Not on file  Stress: Not on file  Social Connections: Not on file  Intimate Partner Violence: Not on file    Review of Systems  All other systems reviewed and are negative.   PHYSICAL EXAMINATION:    BP 108/70 (BP Location: Left Arm, Patient Position: Sitting, Cuff Size: Normal)   Pulse 68   Ht '5\' 4"'$  (1.626 m)   Wt 117 lb (53.1 kg)   BMI 20.08 kg/m     General appearance: alert, cooperative and appears stated age   ASSESSMENT  Osteoporosis.  Scoliosis.   PLAN  BMD from 2021 and 2023 reviewed with patient.  We discussed Actonel, Reclast, Evista and Prolia.  Risks and benefits.  Follow up BMD done every 2 years.  She will see her dentist before starting treatment.  Will refer her to endocrinology at [facility].   She will send me the physician's name through My Chart message. Fu here prn.    An After Visit Summary was printed and given to the patient.  35 min  total time was spent for this patient encounter, including preparation, face-to-face counseling with the patient, coordination of care, and documentation of the encounter.

## 2021-12-02 NOTE — Telephone Encounter (Unsigned)
Please make referral to Dr. Edmonia James, [facility].   My patient has osteoporosis.

## 2021-12-03 NOTE — Telephone Encounter (Signed)
Referral placed in epic to be worked in [employer].

## 2021-12-04 ENCOUNTER — Encounter: Payer: Self-pay | Admitting: Obstetrics and Gynecology

## 2021-12-08 ENCOUNTER — Encounter: Payer: Self-pay | Admitting: Obstetrics and Gynecology

## 2021-12-08 NOTE — Telephone Encounter (Signed)
Cheryl Bullock, please contact insurance to determine if PA needed for [facility]. Dx: M81.0

## 2021-12-22 NOTE — Telephone Encounter (Signed)
Cheryl Bullock,    It appears the prior authorization for the [place] program was routed to [place] ,but I don't see that this was done.   Who will check on this for patient?

## 2021-12-22 NOTE — Telephone Encounter (Signed)
Please contact patient in follow up to her My Chart message.   Sorry for the wait to see the specialist regarding her osteoporosis.  It usually takes several months to see any endocrinologist for a non-urgent issue  Be careful this winter to avoid falls in snow and ice!  Please check the status of prior authorization for the [place] program.

## 2021-12-25 NOTE — Telephone Encounter (Signed)
Call to patient. Left detailed message, ok per dpr.  Advised I have reached out to [facility] at [address], [place]; [phone], awaiting return call for morning information regarding the program. I have completed a [employer] PA form, will have this reviewed and signed by Dr. Quincy Simmonds. Please feel free to contact me directly at [phone], OPT 5 in the meatime.

## 2021-12-29 NOTE — Telephone Encounter (Signed)
MyChart message to patient.  Copy of fax response to Dr. Quincy Simmonds to review and sign to scan into chart.  Routing to provider for final review. Patient is agreeable to disposition. Will close encounter.

## 2022-01-07 DIAGNOSIS — Z961 Presence of intraocular lens: Secondary | ICD-10-CM | POA: Diagnosis not present

## 2022-01-07 DIAGNOSIS — H524 Presbyopia: Secondary | ICD-10-CM | POA: Diagnosis not present

## 2022-04-20 HISTORY — PX: OTHER SURGICAL HISTORY: SHX169

## 2022-05-07 ENCOUNTER — Emergency Department (HOSPITAL_BASED_OUTPATIENT_CLINIC_OR_DEPARTMENT_OTHER): Payer: Medicare PPO

## 2022-05-07 ENCOUNTER — Emergency Department (HOSPITAL_BASED_OUTPATIENT_CLINIC_OR_DEPARTMENT_OTHER): Payer: Medicare PPO | Admitting: Radiology

## 2022-05-07 ENCOUNTER — Encounter (HOSPITAL_BASED_OUTPATIENT_CLINIC_OR_DEPARTMENT_OTHER): Payer: Self-pay

## 2022-05-07 ENCOUNTER — Inpatient Hospital Stay (HOSPITAL_BASED_OUTPATIENT_CLINIC_OR_DEPARTMENT_OTHER)
Admission: EM | Admit: 2022-05-07 | Discharge: 2022-05-11 | DRG: 522 | Disposition: A | Discharge status: Home Health | Payer: Medicare PPO | Attending: Family Medicine | Admitting: Family Medicine

## 2022-05-07 ENCOUNTER — Other Ambulatory Visit: Payer: Self-pay

## 2022-05-07 DIAGNOSIS — R102 Pelvic and perineal pain: Secondary | ICD-10-CM | POA: Diagnosis not present

## 2022-05-07 DIAGNOSIS — D509 Iron deficiency anemia, unspecified: Secondary | ICD-10-CM | POA: Diagnosis present

## 2022-05-07 DIAGNOSIS — Z881 Allergy status to other antibiotic agents status: Secondary | ICD-10-CM | POA: Diagnosis not present

## 2022-05-07 DIAGNOSIS — Z7982 Long term (current) use of aspirin: Secondary | ICD-10-CM | POA: Diagnosis not present

## 2022-05-07 DIAGNOSIS — W010XXA Fall on same level from slipping, tripping and stumbling without subsequent striking against object, initial encounter: Secondary | ICD-10-CM | POA: Diagnosis present

## 2022-05-07 DIAGNOSIS — S7292XA Unspecified fracture of left femur, initial encounter for closed fracture: Secondary | ICD-10-CM | POA: Diagnosis present

## 2022-05-07 DIAGNOSIS — S72042A Displaced fracture of base of neck of left femur, initial encounter for closed fracture: Secondary | ICD-10-CM | POA: Diagnosis not present

## 2022-05-07 DIAGNOSIS — Y92009 Unspecified place in unspecified non-institutional (private) residence as the place of occurrence of the external cause: Secondary | ICD-10-CM | POA: Diagnosis not present

## 2022-05-07 DIAGNOSIS — Z8262 Family history of osteoporosis: Secondary | ICD-10-CM | POA: Diagnosis not present

## 2022-05-07 DIAGNOSIS — K219 Gastro-esophageal reflux disease without esophagitis: Secondary | ICD-10-CM | POA: Diagnosis present

## 2022-05-07 DIAGNOSIS — Z79890 Hormone replacement therapy (postmenopausal): Secondary | ICD-10-CM | POA: Diagnosis not present

## 2022-05-07 DIAGNOSIS — Z90722 Acquired absence of ovaries, bilateral: Secondary | ICD-10-CM

## 2022-05-07 DIAGNOSIS — Z8669 Personal history of other diseases of the nervous system and sense organs: Secondary | ICD-10-CM

## 2022-05-07 DIAGNOSIS — Z471 Aftercare following joint replacement surgery: Secondary | ICD-10-CM | POA: Diagnosis not present

## 2022-05-07 DIAGNOSIS — M25552 Pain in left hip: Secondary | ICD-10-CM | POA: Diagnosis not present

## 2022-05-07 DIAGNOSIS — M8580 Other specified disorders of bone density and structure, unspecified site: Secondary | ICD-10-CM | POA: Diagnosis present

## 2022-05-07 DIAGNOSIS — Z90710 Acquired absence of both cervix and uterus: Secondary | ICD-10-CM

## 2022-05-07 DIAGNOSIS — E039 Hypothyroidism, unspecified: Secondary | ICD-10-CM | POA: Diagnosis present

## 2022-05-07 DIAGNOSIS — N809 Endometriosis, unspecified: Secondary | ICD-10-CM | POA: Diagnosis present

## 2022-05-07 DIAGNOSIS — S72002A Fracture of unspecified part of neck of left femur, initial encounter for closed fracture: Secondary | ICD-10-CM

## 2022-05-07 DIAGNOSIS — E785 Hyperlipidemia, unspecified: Secondary | ICD-10-CM | POA: Diagnosis present

## 2022-05-07 DIAGNOSIS — Z01818 Encounter for other preprocedural examination: Secondary | ICD-10-CM | POA: Diagnosis not present

## 2022-05-07 DIAGNOSIS — Z79899 Other long term (current) drug therapy: Secondary | ICD-10-CM | POA: Diagnosis not present

## 2022-05-07 DIAGNOSIS — D62 Acute posthemorrhagic anemia: Secondary | ICD-10-CM | POA: Diagnosis not present

## 2022-05-07 DIAGNOSIS — M1612 Unilateral primary osteoarthritis, left hip: Secondary | ICD-10-CM | POA: Diagnosis present

## 2022-05-07 DIAGNOSIS — S72012A Unspecified intracapsular fracture of left femur, initial encounter for closed fracture: Secondary | ICD-10-CM | POA: Diagnosis present

## 2022-05-07 DIAGNOSIS — Z800 Family history of malignant neoplasm of digestive organs: Secondary | ICD-10-CM | POA: Diagnosis not present

## 2022-05-07 DIAGNOSIS — Z96642 Presence of left artificial hip joint: Secondary | ICD-10-CM | POA: Diagnosis not present

## 2022-05-07 DIAGNOSIS — H9190 Unspecified hearing loss, unspecified ear: Secondary | ICD-10-CM | POA: Diagnosis present

## 2022-05-07 DIAGNOSIS — M419 Scoliosis, unspecified: Secondary | ICD-10-CM | POA: Diagnosis present

## 2022-05-07 DIAGNOSIS — I959 Hypotension, unspecified: Secondary | ICD-10-CM | POA: Diagnosis not present

## 2022-05-07 DIAGNOSIS — W19XXXA Unspecified fall, initial encounter: Principal | ICD-10-CM

## 2022-05-07 DIAGNOSIS — M25052 Hemarthrosis, left hip: Secondary | ICD-10-CM | POA: Diagnosis present

## 2022-05-07 DIAGNOSIS — G8911 Acute pain due to trauma: Secondary | ICD-10-CM | POA: Diagnosis not present

## 2022-05-07 DIAGNOSIS — E7800 Pure hypercholesterolemia, unspecified: Secondary | ICD-10-CM | POA: Diagnosis present

## 2022-05-07 DIAGNOSIS — M1990 Unspecified osteoarthritis, unspecified site: Secondary | ICD-10-CM | POA: Diagnosis not present

## 2022-05-07 HISTORY — DX: Other complications of anesthesia, initial encounter: T88.59XA

## 2022-05-07 LAB — CBC WITH DIFFERENTIAL/PLATELET
Abs Immature Granulocytes: 0.09 10*3/uL — ABNORMAL HIGH (ref 0.00–0.07)
Basophils Absolute: 0 10*3/uL (ref 0.0–0.1)
Basophils Relative: 0 %
Eosinophils Absolute: 0 10*3/uL (ref 0.0–0.5)
Eosinophils Relative: 0 %
HCT: 40.8 % (ref 36.0–46.0)
Hemoglobin: 13.6 g/dL (ref 12.0–15.0)
Immature Granulocytes: 1 %
Lymphocytes Relative: 6 %
Lymphs Abs: 1 10*3/uL (ref 0.7–4.0)
MCH: 29.6 pg (ref 26.0–34.0)
MCHC: 33.3 g/dL (ref 30.0–36.0)
MCV: 88.9 fL (ref 80.0–100.0)
Monocytes Absolute: 0.6 10*3/uL (ref 0.1–1.0)
Monocytes Relative: 4 %
Neutro Abs: 14 10*3/uL — ABNORMAL HIGH (ref 1.7–7.7)
Neutrophils Relative %: 89 %
Platelets: 190 10*3/uL (ref 150–400)
RBC: 4.59 MIL/uL (ref 3.87–5.11)
RDW: 13.2 % (ref 11.5–15.5)
WBC: 15.8 10*3/uL — ABNORMAL HIGH (ref 4.0–10.5)
nRBC: 0 % (ref 0.0–0.2)

## 2022-05-07 LAB — BASIC METABOLIC PANEL
Anion gap: 12 (ref 5–15)
BUN: 25 mg/dL — ABNORMAL HIGH (ref 8–23)
CO2: 24 mmol/L (ref 22–32)
Calcium: 9.9 mg/dL (ref 8.9–10.3)
Chloride: 101 mmol/L (ref 98–111)
Creatinine, Ser: 0.71 mg/dL (ref 0.44–1.00)
GFR, Estimated: >60 mL/min (ref >60)
Glucose, Bld: 101 mg/dL — ABNORMAL HIGH (ref 70–99)
Potassium: 3.8 mmol/L (ref 3.5–5.1)
Sodium: 137 mmol/L (ref 135–145)

## 2022-05-07 MED ORDER — FENTANYL CITRATE PF 50 MCG/ML IJ SOSY
25.0000 ug | PREFILLED_SYRINGE | Freq: Once | INTRAMUSCULAR | Status: AC
  Administered 2022-05-07: 25 ug via INTRAVENOUS
  Filled 2022-05-07: qty 1

## 2022-05-07 MED ORDER — OXYCODONE-ACETAMINOPHEN 5-325 MG PO TABS
1.0000 | ORAL_TABLET | Freq: Once | ORAL | Status: AC
  Administered 2022-05-07: 1 via ORAL
  Filled 2022-05-07: qty 1

## 2022-05-07 NOTE — ED Notes (Signed)
Patient and family updated.

## 2022-05-07 NOTE — ED Notes (Signed)
EDMD Haviland speaking with patient regarding plan of care.

## 2022-05-07 NOTE — ED Triage Notes (Signed)
Patient here POV from Home.  Endorses Fall today at 1730 when she stumbled in her home and fell forward. Injured her Left Hip against [place].  No Loc. No Head Injury. No Anticoagulants.  NAD Noted during Triage. A&Ox4. GCS 15. BIB wheelchair.

## 2022-05-07 NOTE — ED Notes (Addendum)
PA Soijett notified of patient's continued pain as well as request to discontinue NPO status. States patient to maintain NPO status as it is uncertain when patient will have procedure, acknowledges patient's request for more pain medicine. Patient updated with this information, expresses understanding.

## 2022-05-07 NOTE — ED Provider Notes (Addendum)
[place] EMERGENCY DEPARTMENT AT [facility] Provider Note   CSN: [id number] Arrival date & time: 05/07/22  1851     History  Chief Complaint  Patient presents with   Cheryl Bullock is a 82 y.o. female who presents emergency department with concerns for fall onset prior to arrival.  Notes that it was a mechanical fall that she tripped and fell forward.  Denies hitting her head.  No anticoagulant use.  Notes pain to her left hip which is what she landed on on the hardwood floor.  No meds tried prior to arrival.  Denies chest pain, shortness of breath, dizziness, abdominal pain, nausea, vomiting.  The history is provided by the patient. No language interpreter was used.       Home Medications Prior to Admission medications   Medication Sig Start Date End Date Taking? Authorizing Provider  acetaminophen (TYLENOL) 500 MG tablet Take 1,000 mg by mouth 2 (two) times daily.    [provider]  aspirin 81 MG tablet Take 81 mg by mouth daily.    [provider]  Calcium Carb-Cholecalciferol 600-500 MG-UNIT CAPS Take by mouth. Takes 1/2 tablet daily    [provider]  Cholecalciferol (VITAMIN D3) 2000 UNITS capsule Take 1 capsule by mouth daily.    [provider]  glucosamine-chondroitin 500-400 MG tablet Take 1 tablet by mouth 2 (two) times daily.    [provider]  levothyroxine (SYNTHROID, LEVOTHROID) 75 MCG tablet Take 1 tablet daily by mouth. 01/16/16   [provider]  meloxicam (MOBIC) 7.5 MG tablet     [provider]  Multiple Vitamin (MULTIVITAMIN) capsule Take 1 capsule by mouth daily.    [provider]  rizatriptan (MAXALT) 10 MG tablet Take 1 tablet by mouth 3 (three) times daily as needed.    [provider]  simvastatin (ZOCOR) 5 MG tablet Take 1 tablet by mouth daily.    [provider]      Allergies    Clarithromycin    Review of Systems   Review of Systems   All other systems reviewed and are negative.   Physical Exam Updated Vital Signs BP 137/64   Pulse 76   Temp 98 F (36.7 C) (Oral)   Resp 18   Ht  (1.626 m)   Wt 53.1 kg   SpO2 93%   BMI 20.09 kg/m  Physical Exam Vitals and nursing note reviewed.  Constitutional:      General: She is not in acute distress.    Appearance: Normal appearance.  Eyes:     General: No scleral icterus.    Extraocular Movements: Extraocular movements intact.  Cardiovascular:     Rate and Rhythm: Normal rate and regular rhythm.     Pulses: Normal pulses.     Heart sounds: Normal heart sounds.  Pulmonary:     Effort: Pulmonary effort is normal. No respiratory distress.     Breath sounds: Normal breath sounds.  Abdominal:     Palpations: Abdomen is soft. There is no mass.     Tenderness: There is no abdominal tenderness.  Musculoskeletal:        General: Normal range of motion.     Cervical back: Neck supple.     Comments: Tenderness to palpation noted to lateral aspect of left hip and left femur without overlying skin changes.  Decreased range of motion of left hip secondary to pain.  Skin:    General: Skin  is warm and dry.     Findings: No rash.  Neurological:     Mental Status: She is alert.     Sensory: Sensation is intact.     Motor: Motor function is intact.  Psychiatric:        Behavior: Behavior normal.     ED Results / Procedures / Treatments   Labs (all labs ordered are listed, but only abnormal results are displayed) Labs Reviewed  BASIC METABOLIC PANEL - Abnormal; Notable for the following components:      Result Value   Glucose, Bld 101 (*)    BUN 25 (*)    All other components within normal limits  CBC WITH DIFFERENTIAL/PLATELET - Abnormal; Notable for the following components:   WBC 15.8 (*)    Neutro Abs 14.0 (*)    Abs Immature Granulocytes 0.09 (*)    All other components within normal limits    EKG None  Radiology CT Hip Left Wo Contrast  Result  Date: 05/07/2022 CLINICAL DATA:  Proximal left femoral fracture. EXAM: CT OF THE LEFT HIP WITHOUT CONTRAST TECHNIQUE: Multidetector CT imaging of the left hip was performed according to the standard protocol. Multiplanar CT image reconstructions were also generated. RADIATION DOSE REDUCTION: This exam was performed according to the departmental dose-optimization program which includes automated exposure control, adjustment of the mA and/or kV according to patient size and/or use of iterative reconstruction technique. COMPARISON:  Left hip and femur films earlier today. FINDINGS: Bones/Joint/Cartilage Osteopenia. There is an acute transverse subcapital fracture of the proximal left femur with impaction and with slight anterior translation of the main distal fragment. There are a few tiny comminution fragments along the posterior fracture margin. No fracture is seen of the acetabulum, of the left pelvic ring or the visualized portion of the left hemisacrum. There is joint space loss at the left hip with acetabular and femoral head osteophytes and labral chondrocalcinosis. There are degenerative subcortical cystic changes along the acetabulum greatest superiorly. The joint space loss is greatest superiorly where it is bone-on-bone. There is spurring and sclerosis with subcortical cystic changes at the symphysis pubis and calcified pannus in the joint space. Mild spurring of the visualized left SI joint. Ligaments Suboptimally assessed by CT. Muscles and Tendons No intramuscular hematoma. Area tendons are not well seen with this technique but unremarkable as far as visualized. Soft tissues There is moderate stranding subcutaneously in the lateral left hip area at the level of the greater trochanter. No space-occupying hematoma. Visualized soft tissue structures within the left hemipelvis show no acute findings. IMPRESSION: 1. Acute transverse subcapital fracture of the proximal left femur with impaction and with slight  anterior translation of the main distal fragment. 2. No fracture of the acetabulum, of the left pelvic ring or the visualized portion of the left hemisacrum. 3. Osteopenia and degenerative change. 4. Moderate subcutaneous stranding in the lateral left hip area at the level of the greater trochanter. No space-occupying hematoma. Electronically Signed   By: Almira Bar M.D.   On: 05/07/2022 22:14   DG Pelvis 1-2 Views  Result Date: 05/07/2022 CLINICAL DATA:  Left hip pain, fall today. EXAM: PELVIS - 1-2 VIEW COMPARISON:  None Available. FINDINGS: Cortical offset of the left femoral neck suspicious for nondisplaced fracture. No additional fracture of the pelvis. 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 No symphyseal or sacroiliac diastasis. No pubic rami fracture. IMPRESSION: Cortical offset of the left femoral neck suspicious  for nondisplaced fracture. Electronically Signed   By: Narda Rutherford M.D.   On: 05/07/2022 20:02   DG FEMUR MIN 2 VIEWS LEFT  Result Date: 05/07/2022 CLINICAL DATA:  Left hip pain. Fall. EXAM: LEFT FEMUR 2 VIEWS COMPARISON:  None Available. FINDINGS: Cortical offset of the femoral neck suspicious for nondisplaced fracture. The distal femur is intact. Knee alignment is maintained. No evident fracture the pubic rami. IMPRESSION: Cortical offset of the left femoral neck suspicious for nondisplaced fracture. Electronically Signed   By: Narda Rutherford M.D.   On: 05/07/2022 20:01    Procedures Procedures    Medications Ordered in ED Medications  fentaNYL (SUBLIMAZE) injection 25 mcg (25 mcg Intravenous Given 05/07/22 2023)  oxyCODONE-acetaminophen (PERCOCET/ROXICET) 5-325 MG per tablet 1 tablet (1 tablet Oral Given 05/07/22 2124)    ED Course/ Medical Decision Making/ A&P Clinical Course as of 05/08/22 0026  Thu May 07, 2022  2012 Discussed with patient and friend at bedside regarding imaging findings as well as  treatment plan.  [SB]  2022 Consult with orthopedist, Dr. Roda Shutters who recommends CT hip at this time.  [SB]  2200 Consult with orthopedist, Dr. Rennis Chris who recommends if CT read is indicating a fracture then admitted to [facility] with medicine service and they will be available for consult.  If CT hip x-ray negative and patient is ambulatory in the emergency department they will follow-up in the outpatient setting.  However if patient is nonambulatory with a negative CT admit to medicine and they will be available for consult. [SB]  2337 Consult to hospitalist Dr. Margo Aye who will admit the patient [SB]  Fri May 08, 2022  0024 Discussion with patient and family member at bedside in length regarding patient's pain management.  Patient notes that her pain did not improve at all throughout her time in the emergency department.  She notes that she has not been managed well.  Discussed with patient that the nurses have been on top of her care and also on top of informing me.  Discussed with patient that I was informed at 8:00 that patient's pain had improved from a 9 to a 5 with the fentanyl and Percocet.  Offered patient either additional dose of fentanyl or Percocet.  At this time patient is agreeable to Percocet.  Patient also requesting her recliner for her friend. [SB]    Clinical Course User Index [SB] Beadie Matsunaga A, PA-C                             Medical Decision Making Amount and/or Complexity of Data Reviewed Labs: ordered. Radiology: ordered.  Risk Prescription drug management. Decision regarding hospitalization.   Pt with mechanical fall occurring PTA. Denies hitting their head, LOC, headache, vision changes. Vital signs patient afebrile. On exam, patient with Tenderness to palpation noted to lateral aspect of left hip and left femur without overlying skin changes.  Decreased range of motion of left hip secondary to pain. No acute cardiovascular, respiratory, or abdominal exam findings.  Differential diagnosis includes fracture, dislocation, contusion, bursitis.   Labs:  I ordered, and personally interpreted labs.  The pertinent results include:   CBC with leukocytosis at 15.8 otherwise unremarkable BMP unremarkable  Imaging: I ordered imaging studies including left femur, pelvis x-ray, CT left hip I independently visualized and interpreted imaging which showed: Pelvis x-ray notable for nondisplaced femoral neck fracture.  CT left hip with  1. Acute transverse subcapital fracture  of the proximal left femur  with impaction and with slight anterior translation of the main  distal fragment.  2. No fracture of the acetabulum, of the left pelvic ring or the  visualized portion of the left hemisacrum.  3. Osteopenia and degenerative change.  4. Moderate subcutaneous stranding in the lateral left hip area at  the level of the greater trochanter. No space-occupying hematoma.   I agree with the radiologist interpretation  Medications:  I ordered medication including fentanyl, Percocet for symptom management Reevaluation of the patient after these medicines and interventions, I reevaluated the patient and found that they have improved I have reviewed the patients home medicines and have made adjustments as needed   Consultations: I requested consultation with the orthopedist, Dr. Roda Shutters and discussed lab and imaging findings as well as pertinent plan - they recommend: CT left hip -Consultation with [facility] orthopedist, Dr. Rennis Chris at patient request who recommends patient be admitted to [facility] and they will be available for consult should there be a fracture on the CT reviewed.  If CT is negative and patient is able to ambulate in the emergency department then patient can be discharged home with Ortho follow-up.  If CT is negative and patient is not able to ambulate patient to be admitted to [facility] with medicine and Ortho to follow-up. -Consultation with hospitalist, Dr.  Margo Aye who agrees with admission at this time.   Disposition: Presentation suspicious for femoral neck fracture without dislocation at this time. After consideration of the diagnostic results and the patients response to treatment, I feel that the patient would benefit from Admission to the hospital.  Discussed with patient and family member at bedside regarding plans for admission.  Patient and family member agreeable.   This chart was dictated using voice recognition software, Dragon. Despite the best efforts of this provider to proofread and correct errors, errors may still occur which can change documentation meaning. Final Clinical Impression(s) / ED Diagnoses Final diagnoses:  Fall, initial encounter  Closed fracture of neck of left femur, initial encounter    Rx / DC Orders ED Discharge Orders     None         Jaylon Boylen A, PA-C 05/08/22 0002    Karis Emig A, PA-C 05/08/22 0026    Jacalyn Lefevre, MD 05/08/22 1557

## 2022-05-08 ENCOUNTER — Inpatient Hospital Stay (HOSPITAL_COMMUNITY): Payer: Medicare PPO | Admitting: Certified Registered Nurse Anesthetist

## 2022-05-08 ENCOUNTER — Inpatient Hospital Stay (HOSPITAL_COMMUNITY): Payer: Medicare PPO

## 2022-05-08 ENCOUNTER — Encounter (HOSPITAL_COMMUNITY)
Admission: EM | Disposition: A | Discharge status: Home Health | Payer: Self-pay | Source: Home / Self Care | Attending: Family Medicine

## 2022-05-08 ENCOUNTER — Other Ambulatory Visit: Payer: Self-pay

## 2022-05-08 ENCOUNTER — Encounter (HOSPITAL_COMMUNITY): Payer: Self-pay | Admitting: Internal Medicine

## 2022-05-08 ENCOUNTER — Inpatient Hospital Stay (HOSPITAL_COMMUNITY): Payer: Medicare PPO | Admitting: Anesthesiology

## 2022-05-08 DIAGNOSIS — W010XXA Fall on same level from slipping, tripping and stumbling without subsequent striking against object, initial encounter: Secondary | ICD-10-CM | POA: Diagnosis present

## 2022-05-08 DIAGNOSIS — Y92009 Unspecified place in unspecified non-institutional (private) residence as the place of occurrence of the external cause: Secondary | ICD-10-CM | POA: Diagnosis not present

## 2022-05-08 DIAGNOSIS — D62 Acute posthemorrhagic anemia: Secondary | ICD-10-CM | POA: Diagnosis not present

## 2022-05-08 DIAGNOSIS — M419 Scoliosis, unspecified: Secondary | ICD-10-CM | POA: Diagnosis present

## 2022-05-08 DIAGNOSIS — Z8262 Family history of osteoporosis: Secondary | ICD-10-CM | POA: Diagnosis not present

## 2022-05-08 DIAGNOSIS — M25052 Hemarthrosis, left hip: Secondary | ICD-10-CM | POA: Diagnosis present

## 2022-05-08 DIAGNOSIS — Z79890 Hormone replacement therapy (postmenopausal): Secondary | ICD-10-CM | POA: Diagnosis not present

## 2022-05-08 DIAGNOSIS — Z881 Allergy status to other antibiotic agents status: Secondary | ICD-10-CM | POA: Diagnosis not present

## 2022-05-08 DIAGNOSIS — M8580 Other specified disorders of bone density and structure, unspecified site: Secondary | ICD-10-CM | POA: Diagnosis present

## 2022-05-08 DIAGNOSIS — Z79899 Other long term (current) drug therapy: Secondary | ICD-10-CM | POA: Diagnosis not present

## 2022-05-08 DIAGNOSIS — S7292XA Unspecified fracture of left femur, initial encounter for closed fracture: Secondary | ICD-10-CM

## 2022-05-08 DIAGNOSIS — D509 Iron deficiency anemia, unspecified: Secondary | ICD-10-CM | POA: Diagnosis present

## 2022-05-08 DIAGNOSIS — E7800 Pure hypercholesterolemia, unspecified: Secondary | ICD-10-CM | POA: Diagnosis present

## 2022-05-08 DIAGNOSIS — Z800 Family history of malignant neoplasm of digestive organs: Secondary | ICD-10-CM | POA: Diagnosis not present

## 2022-05-08 DIAGNOSIS — S72012A Unspecified intracapsular fracture of left femur, initial encounter for closed fracture: Secondary | ICD-10-CM | POA: Diagnosis present

## 2022-05-08 DIAGNOSIS — M1990 Unspecified osteoarthritis, unspecified site: Secondary | ICD-10-CM

## 2022-05-08 DIAGNOSIS — I959 Hypotension, unspecified: Secondary | ICD-10-CM | POA: Diagnosis not present

## 2022-05-08 DIAGNOSIS — N809 Endometriosis, unspecified: Secondary | ICD-10-CM | POA: Diagnosis present

## 2022-05-08 DIAGNOSIS — Z90722 Acquired absence of ovaries, bilateral: Secondary | ICD-10-CM | POA: Diagnosis not present

## 2022-05-08 DIAGNOSIS — E039 Hypothyroidism, unspecified: Secondary | ICD-10-CM | POA: Diagnosis present

## 2022-05-08 DIAGNOSIS — M1612 Unilateral primary osteoarthritis, left hip: Secondary | ICD-10-CM | POA: Diagnosis present

## 2022-05-08 DIAGNOSIS — H9190 Unspecified hearing loss, unspecified ear: Secondary | ICD-10-CM | POA: Diagnosis present

## 2022-05-08 DIAGNOSIS — K219 Gastro-esophageal reflux disease without esophagitis: Secondary | ICD-10-CM | POA: Diagnosis present

## 2022-05-08 DIAGNOSIS — Z7982 Long term (current) use of aspirin: Secondary | ICD-10-CM | POA: Diagnosis not present

## 2022-05-08 DIAGNOSIS — Z90710 Acquired absence of both cervix and uterus: Secondary | ICD-10-CM | POA: Diagnosis not present

## 2022-05-08 HISTORY — PX: TOTAL HIP ARTHROPLASTY: SHX124

## 2022-05-08 LAB — SURGICAL PCR SCREEN
MRSA, PCR: NEGATIVE
Staphylococcus aureus: NEGATIVE

## 2022-05-08 SURGERY — ARTHROPLASTY, HIP, TOTAL, ANTERIOR APPROACH
Anesthesia: Spinal | Site: Hip | Laterality: Left

## 2022-05-08 MED ORDER — HYDROMORPHONE HCL 1 MG/ML IJ SOLN: 0.5000 mg | INTRAMUSCULAR | Status: DC | PRN

## 2022-05-08 MED ORDER — OXYCODONE HCL 5 MG/5ML PO SOLN: 5.0000 mg | Freq: Once | ORAL | Status: DC | PRN

## 2022-05-08 MED ORDER — METHOCARBAMOL 500 MG PO TABS
500.0000 mg | ORAL_TABLET | Freq: Four times a day (QID) | ORAL | Status: DC | PRN
  Administered 2022-05-09 (×2): 500 mg via ORAL
  Filled 2022-05-08 (×2): qty 1

## 2022-05-08 MED ORDER — FENTANYL CITRATE (PF) 100 MCG/2ML IJ SOLN
INTRAMUSCULAR | Status: DC | PRN
  Administered 2022-05-08: 50 ug via INTRAVENOUS

## 2022-05-08 MED ORDER — LIDOCAINE HCL (PF) 2 % IJ SOLN
INTRAMUSCULAR | Status: AC
  Filled 2022-05-08: qty 5

## 2022-05-08 MED ORDER — ACETAMINOPHEN 650 MG RE SUPP: 650.0000 mg | Freq: Four times a day (QID) | RECTAL | Status: DC | PRN

## 2022-05-08 MED ORDER — BUPIVACAINE-EPINEPHRINE (PF) 0.5% -1:200000 IJ SOLN
INTRAMUSCULAR | Status: DC | PRN
  Administered 2022-05-08: 25 mL

## 2022-05-08 MED ORDER — CALCIUM CARB-CHOLECALCIFEROL 600-500 MG-UNIT PO CAPS: 1.0000 | ORAL_CAPSULE | Freq: Every day | ORAL | Status: DC

## 2022-05-08 MED ORDER — FENTANYL CITRATE PF 50 MCG/ML IJ SOSY: 50.0000 ug | PREFILLED_SYRINGE | Freq: Once | INTRAMUSCULAR | Status: AC

## 2022-05-08 MED ORDER — FENTANYL CITRATE (PF) 100 MCG/2ML IJ SOLN
INTRAMUSCULAR | Status: AC
  Filled 2022-05-08: qty 2

## 2022-05-08 MED ORDER — FENTANYL CITRATE PF 50 MCG/ML IJ SOSY
PREFILLED_SYRINGE | INTRAMUSCULAR | Status: AC
  Administered 2022-05-08: 50 ug via INTRAVENOUS
  Filled 2022-05-08: qty 1

## 2022-05-08 MED ORDER — SIMVASTATIN 5 MG PO TABS
5.0000 mg | ORAL_TABLET | Freq: Every day | ORAL | Status: DC
  Administered 2022-05-09 – 2022-05-11 (×3): 5 mg via ORAL
  Filled 2022-05-08 (×3): qty 1

## 2022-05-08 MED ORDER — PROPOFOL 10 MG/ML IV BOLUS
INTRAVENOUS | Status: AC
  Filled 2022-05-08: qty 20

## 2022-05-08 MED ORDER — PHENYLEPHRINE HCL (PRESSORS) 10 MG/ML IV SOLN
INTRAVENOUS | Status: DC | PRN
  Administered 2022-05-08: 80 ug via INTRAVENOUS
  Administered 2022-05-08: 160 ug via INTRAVENOUS
  Administered 2022-05-08: 80 ug via INTRAVENOUS
  Administered 2022-05-08: 160 ug via INTRAVENOUS

## 2022-05-08 MED ORDER — ONDANSETRON HCL 4 MG/2ML IJ SOLN: 4.0000 mg | Freq: Four times a day (QID) | INTRAMUSCULAR | Status: DC | PRN

## 2022-05-08 MED ORDER — MEPERIDINE HCL 50 MG/ML IJ SOLN: 6.2500 mg | INTRAMUSCULAR | Status: DC | PRN

## 2022-05-08 MED ORDER — PROPOFOL 500 MG/50ML IV EMUL
INTRAVENOUS | Status: DC | PRN
  Administered 2022-05-08: 50 ug/kg/min via INTRAVENOUS

## 2022-05-08 MED ORDER — OXYCODONE HCL 5 MG PO TABS: 5.0000 mg | ORAL_TABLET | Freq: Once | ORAL | Status: DC | PRN

## 2022-05-08 MED ORDER — PROMETHAZINE HCL 25 MG/ML IJ SOLN: 6.2500 mg | INTRAMUSCULAR | Status: DC | PRN

## 2022-05-08 MED ORDER — CALCIUM CARBONATE 1250 (500 CA) MG PO TABS
1.0000 | ORAL_TABLET | Freq: Every day | ORAL | Status: DC
  Administered 2022-05-09 – 2022-05-11 (×3): 1250 mg via ORAL
  Filled 2022-05-08 (×3): qty 1

## 2022-05-08 MED ORDER — OXYCODONE-ACETAMINOPHEN 5-325 MG PO TABS
1.0000 | ORAL_TABLET | Freq: Once | ORAL | Status: AC
  Administered 2022-05-08: 1 via ORAL
  Filled 2022-05-08: qty 1

## 2022-05-08 MED ORDER — OXYCODONE HCL 5 MG PO TABS
5.0000 mg | ORAL_TABLET | ORAL | Status: DC | PRN
  Administered 2022-05-09 (×2): 5 mg via ORAL
  Filled 2022-05-08: qty 1

## 2022-05-08 MED ORDER — METHOCARBAMOL 500 MG IVPB - SIMPLE MED: 500.0000 mg | Freq: Four times a day (QID) | INTRAVENOUS | Status: DC | PRN

## 2022-05-08 MED ORDER — PHENYLEPHRINE HCL-NACL 20-0.9 MG/250ML-% IV SOLN
INTRAVENOUS | Status: DC | PRN
  Administered 2022-05-08: 30 ug/min via INTRAVENOUS

## 2022-05-08 MED ORDER — HYDROMORPHONE HCL 1 MG/ML IJ SOLN: 0.2500 mg | INTRAMUSCULAR | Status: DC | PRN

## 2022-05-08 MED ORDER — FENTANYL CITRATE PF 50 MCG/ML IJ SOSY
12.5000 ug | PREFILLED_SYRINGE | Freq: Once | INTRAMUSCULAR | Status: DC
  Filled 2022-05-08: qty 1

## 2022-05-08 MED ORDER — ONDANSETRON HCL 4 MG PO TABS: 4.0000 mg | ORAL_TABLET | Freq: Four times a day (QID) | ORAL | Status: DC | PRN

## 2022-05-08 MED ORDER — METOCLOPRAMIDE HCL 5 MG/ML IJ SOLN: 5.0000 mg | Freq: Three times a day (TID) | INTRAMUSCULAR | Status: DC | PRN

## 2022-05-08 MED ORDER — CEFAZOLIN SODIUM-DEXTROSE 2-4 GM/100ML-% IV SOLN
2.0000 g | Freq: Once | INTRAVENOUS | Status: AC
  Administered 2022-05-08: 2 g via INTRAVENOUS

## 2022-05-08 MED ORDER — DIPHENHYDRAMINE HCL 12.5 MG/5ML PO ELIX: 12.5000 mg | ORAL_SOLUTION | ORAL | Status: DC | PRN

## 2022-05-08 MED ORDER — PANTOPRAZOLE SODIUM 40 MG PO TBEC
40.0000 mg | DELAYED_RELEASE_TABLET | Freq: Every day | ORAL | Status: DC
  Administered 2022-05-09 – 2022-05-11 (×3): 40 mg via ORAL
  Filled 2022-05-08 (×3): qty 1

## 2022-05-08 MED ORDER — ACETAMINOPHEN 325 MG PO TABS: 325.0000 mg | ORAL_TABLET | Freq: Four times a day (QID) | ORAL | Status: DC | PRN

## 2022-05-08 MED ORDER — SODIUM CHLORIDE 0.9 % IV SOLN: INTRAVENOUS | Status: DC

## 2022-05-08 MED ORDER — PHENYLEPHRINE 80 MCG/ML (10ML) SYRINGE FOR IV PUSH (FOR BLOOD PRESSURE SUPPORT)
PREFILLED_SYRINGE | INTRAVENOUS | Status: AC
  Filled 2022-05-08: qty 20

## 2022-05-08 MED ORDER — ACETAMINOPHEN 500 MG PO TABS
1000.0000 mg | ORAL_TABLET | Freq: Once | ORAL | Status: AC
  Administered 2022-05-08: 1000 mg via ORAL
  Filled 2022-05-08: qty 2

## 2022-05-08 MED ORDER — SODIUM CHLORIDE 0.9 % IR SOLN
Status: DC | PRN
  Administered 2022-05-08: 1000 mL

## 2022-05-08 MED ORDER — MORPHINE SULFATE (PF) 2 MG/ML IV SOLN
2.0000 mg | INTRAVENOUS | Status: DC | PRN
  Administered 2022-05-08 (×2): 2 mg via INTRAVENOUS
  Filled 2022-05-08 (×2): qty 1

## 2022-05-08 MED ORDER — MENTHOL 3 MG MT LOZG: 1.0000 | LOZENGE | OROMUCOSAL | Status: DC | PRN

## 2022-05-08 MED ORDER — ASPIRIN 81 MG PO CHEW
81.0000 mg | CHEWABLE_TABLET | Freq: Two times a day (BID) | ORAL | Status: DC
  Administered 2022-05-09 – 2022-05-11 (×6): 81 mg via ORAL
  Filled 2022-05-08 (×6): qty 1

## 2022-05-08 MED ORDER — LEVOTHYROXINE SODIUM 75 MCG PO TABS
75.0000 ug | ORAL_TABLET | Freq: Every day | ORAL | Status: DC
  Administered 2022-05-09 – 2022-05-11 (×3): 75 ug via ORAL
  Filled 2022-05-08 (×3): qty 1

## 2022-05-08 MED ORDER — DOCUSATE SODIUM 100 MG PO CAPS
100.0000 mg | ORAL_CAPSULE | Freq: Two times a day (BID) | ORAL | Status: DC
  Administered 2022-05-09 – 2022-05-11 (×6): 100 mg via ORAL
  Filled 2022-05-08 (×6): qty 1

## 2022-05-08 MED ORDER — DEXAMETHASONE SODIUM PHOSPHATE 10 MG/ML IJ SOLN
INTRAMUSCULAR | Status: AC
  Filled 2022-05-08: qty 1

## 2022-05-08 MED ORDER — DEXAMETHASONE SODIUM PHOSPHATE 10 MG/ML IJ SOLN
INTRAMUSCULAR | Status: DC | PRN
  Administered 2022-05-08: 10 mg via INTRAVENOUS

## 2022-05-08 MED ORDER — ONDANSETRON HCL 4 MG/2ML IJ SOLN
INTRAMUSCULAR | Status: AC
  Filled 2022-05-08: qty 2

## 2022-05-08 MED ORDER — PHENOL 1.4 % MT LIQD: 1.0000 | OROMUCOSAL | Status: DC | PRN

## 2022-05-08 MED ORDER — ACETAMINOPHEN 325 MG PO TABS
650.0000 mg | ORAL_TABLET | Freq: Four times a day (QID) | ORAL | Status: DC | PRN
  Administered 2022-05-09: 650 mg via ORAL
  Filled 2022-05-08: qty 2

## 2022-05-08 MED ORDER — CHLORHEXIDINE GLUCONATE 0.12 % MT SOLN
15.0000 mL | Freq: Once | OROMUCOSAL | Status: AC
  Administered 2022-05-08: 15 mL via OROMUCOSAL

## 2022-05-08 MED ORDER — METOCLOPRAMIDE HCL 5 MG PO TABS: 5.0000 mg | ORAL_TABLET | Freq: Three times a day (TID) | ORAL | Status: DC | PRN

## 2022-05-08 MED ORDER — ONDANSETRON HCL 4 MG/2ML IJ SOLN
INTRAMUSCULAR | Status: DC | PRN
  Administered 2022-05-08: 4 mg via INTRAVENOUS

## 2022-05-08 MED ORDER — CEFAZOLIN SODIUM-DEXTROSE 2-4 GM/100ML-% IV SOLN
INTRAVENOUS | Status: AC
  Filled 2022-05-08: qty 100

## 2022-05-08 MED ORDER — OXYCODONE HCL 5 MG PO TABS
5.0000 mg | ORAL_TABLET | Freq: Four times a day (QID) | ORAL | Status: DC | PRN
  Administered 2022-05-10 – 2022-05-11 (×2): 5 mg via ORAL
  Filled 2022-05-08 (×3): qty 1

## 2022-05-08 MED ORDER — CEFAZOLIN SODIUM-DEXTROSE 1-4 GM/50ML-% IV SOLN
1.0000 g | Freq: Four times a day (QID) | INTRAVENOUS | Status: AC
  Administered 2022-05-09 (×2): 1 g via INTRAVENOUS
  Filled 2022-05-08 (×2): qty 50

## 2022-05-08 MED ORDER — 0.9 % SODIUM CHLORIDE (POUR BTL) OPTIME
TOPICAL | Status: DC | PRN
  Administered 2022-05-08: 1000 mL

## 2022-05-08 MED ORDER — VITAMIN D 25 MCG (1000 UNIT) PO TABS
2000.0000 [IU] | ORAL_TABLET | Freq: Every day | ORAL | Status: DC
  Administered 2022-05-09 – 2022-05-11 (×3): 2000 [IU] via ORAL
  Filled 2022-05-08 (×3): qty 2

## 2022-05-08 MED ORDER — SODIUM CHLORIDE 0.9 % IV SOLN: INTRAVENOUS | Status: AC

## 2022-05-08 MED ORDER — OXYCODONE HCL 5 MG PO TABS: 10.0000 mg | ORAL_TABLET | ORAL | Status: DC | PRN

## 2022-05-08 MED ORDER — MIDAZOLAM HCL 2 MG/2ML IJ SOLN: 0.5000 mg | Freq: Once | INTRAMUSCULAR | Status: DC | PRN

## 2022-05-08 MED ORDER — BUPIVACAINE IN DEXTROSE 0.75-8.25 % IT SOLN
INTRATHECAL | Status: DC | PRN
  Administered 2022-05-08: 12 mg via INTRATHECAL

## 2022-05-08 MED ORDER — LACTATED RINGERS IV SOLN: INTRAVENOUS | Status: DC

## 2022-05-08 SURGICAL SUPPLY — 44 items
APL SKNCLS STERI-STRIP NONHPOA (GAUZE/BANDAGES/DRESSINGS)
BAG COUNTER SPONGE SURGICOUNT (BAG) ×1 IMPLANT
BAG SPEC THK2 15X12 ZIP CLS (MISCELLANEOUS)
BAG SPNG CNTER NS LX DISP (BAG) ×1
BAG ZIPLOCK 12X15 (MISCELLANEOUS) IMPLANT
BENZOIN TINCTURE PRP APPL 2/3 (GAUZE/BANDAGES/DRESSINGS) IMPLANT
BLADE SAW SGTL 18X1.27X75 (BLADE) ×1 IMPLANT
COVER PERINEAL POST (MISCELLANEOUS) ×1 IMPLANT
COVER SURGICAL LIGHT HANDLE (MISCELLANEOUS) ×1 IMPLANT
CUP SECTOR GRIPTON 50MM (Cup) IMPLANT
DRAPE FOOT SWITCH (DRAPES) ×1 IMPLANT
DRAPE STERI IOBAN 125X83 (DRAPES) ×1 IMPLANT
DRAPE U-SHAPE 47X51 STRL (DRAPES) ×2 IMPLANT
DRESSING AQUACEL AG SP 3.5X10 (GAUZE/BANDAGES/DRESSINGS) IMPLANT
DRSG AQUACEL AG ADV 3.5X10 (GAUZE/BANDAGES/DRESSINGS) ×1 IMPLANT
DRSG AQUACEL AG SP 3.5X10 (GAUZE/BANDAGES/DRESSINGS) ×1
DURAPREP 26ML APPLICATOR (WOUND CARE) ×1 IMPLANT
ELECT REM PT RETURN 15FT ADLT (MISCELLANEOUS) ×1 IMPLANT
GAUZE XEROFORM 1X8 LF (GAUZE/BANDAGES/DRESSINGS) ×1 IMPLANT
GLOVE BIO SURGEON STRL SZ7.5 (GLOVE) ×1 IMPLANT
GLOVE BIOGEL PI IND STRL 8 (GLOVE) ×2 IMPLANT
GLOVE ECLIPSE 8.0 STRL XLNG CF (GLOVE) ×1 IMPLANT
GOWN STRL REUS W/ TWL XL LVL3 (GOWN DISPOSABLE) ×2 IMPLANT
GOWN STRL REUS W/TWL XL LVL3 (GOWN DISPOSABLE) ×2
HANDPIECE INTERPULSE COAX TIP (DISPOSABLE) ×1
HEAD FEM STD 32X+1 STRL (Hips) IMPLANT
HOLDER FOLEY CATH W/STRAP (MISCELLANEOUS) ×1 IMPLANT
KIT TURNOVER KIT A (KITS) IMPLANT
LINER ACET PNNCL PLUS4 NEUTRAL (Hips) IMPLANT
PACK ANTERIOR HIP CUSTOM (KITS) ×1 IMPLANT
PINNACLE PLUS 4 NEUTRAL (Hips) ×1 IMPLANT
SET HNDPC FAN SPRY TIP SCT (DISPOSABLE) ×1 IMPLANT
STAPLER VISISTAT 35W (STAPLE) IMPLANT
STEM CORAIL KA11 (Stem) IMPLANT
STRIP CLOSURE SKIN 1/2X4 (GAUZE/BANDAGES/DRESSINGS) IMPLANT
SUT ETHIBOND NAB CT1 #1 30IN (SUTURE) ×1 IMPLANT
SUT ETHILON 2 0 PS N (SUTURE) IMPLANT
SUT MNCRL AB 4-0 PS2 18 (SUTURE) IMPLANT
SUT VIC AB 0 CT1 36 (SUTURE) ×1 IMPLANT
SUT VIC AB 1 CT1 36 (SUTURE) ×1 IMPLANT
SUT VIC AB 2-0 CT1 27 (SUTURE) ×2
SUT VIC AB 2-0 CT1 TAPERPNT 27 (SUTURE) ×2 IMPLANT
TRAY FOLEY MTR SLVR 16FR STAT (SET/KITS/TRAYS/PACK) IMPLANT
YANKAUER SUCT BULB TIP NO VENT (SUCTIONS) ×1 IMPLANT

## 2022-05-08 NOTE — ED Notes (Signed)
Contacted DO Margo Aye regarding patient's request for pain medicine after PA Soijett states she is deferring requests to hospitalist. No new orders.

## 2022-05-08 NOTE — Anesthesia Preprocedure Evaluation (Signed)
Anesthesia Evaluation  Patient identified by MRN, date of birth, ID band Patient awake    Reviewed: Allergy & Precautions, NPO status , Patient's Chart, lab work & pertinent test results  History of Anesthesia Complications Negative for: history of anesthetic complications  Airway Mallampati: I  TM Distance: >3 FB Neck ROM: Full    Dental  (+) Dental Advisory Given   Pulmonary neg pulmonary ROS   breath sounds clear to auscultation       Cardiovascular negative cardio ROS  Rhythm:Regular Rate:Normal     Neuro/Psych  Headaches    GI/Hepatic Neg liver ROS,GERD  Controlled,,  Endo/Other  Hypothyroidism    Renal/GU negative Renal ROS     Musculoskeletal  (+) Arthritis ,    Abdominal   Peds  Hematology negative hematology ROS (+)   Anesthesia Other Findings   Reproductive/Obstetrics                             Anesthesia Physical Anesthesia Plan  ASA: 2  Anesthesia Plan: Spinal   Post-op Pain Management: Tylenol PO (pre-op)*   Induction: Intravenous  PONV Risk Score and Plan: 2 and Ondansetron and Treatment may vary due to age or medical condition  Airway Management Planned: Simple Face Mask and Natural Airway  Additional Equipment: None  Intra-op Plan:   Post-operative Plan:   Informed Consent: I have reviewed the patients History and Physical, chart, labs and discussed the procedure including the risks, benefits and alternatives for the proposed anesthesia with the patient or authorized representative who has indicated his/her understanding and acceptance.     Dental advisory given  Plan Discussed with: CRNA and Surgeon  Anesthesia Plan Comments:        Anesthesia Quick Evaluation

## 2022-05-08 NOTE — Anesthesia Procedure Notes (Signed)
Anesthesia Regional Block: Peng block   Pre-Anesthetic Checklist: , timeout performed,  Correct Patient, Correct Site, Correct Laterality,  Correct Procedure, Correct Position, site marked,  Risks and benefits discussed,  Surgical consent,  Pre-op evaluation,  At surgeon's request and post-op pain management  Laterality: Left  Prep: chloraprep       Needles:  Injection technique: Single-shot  Needle Type: Echogenic Needle     Needle Length: 9cm  Needle Gauge: 21     Additional Needles:   Procedures:,,,, ultrasound used (permanent image in chart),,    Narrative:  Start time: 05/08/2022 2:20 PM End time: 05/08/2022 2:33 PM Injection made incrementally with aspirations every 5 mL.  Performed by: Personally  Anesthesiologist: Jairo Ben, MD  Additional Notes: Pt identified in Holding room.  Monitors applied. Working IV access confirmed. Timeout, Sterile prep L hip ASIS.  #21ga ECHOgenic Arrow block needle to PENG with US guidance.  25cc 0.5% Bupivacaine 1:200k epi injected incrementally after negative test dose.  Patient asymptomatic, VSS, no heme aspirated, tolerated well.   Sandford Craze, MD

## 2022-05-08 NOTE — Progress Notes (Signed)
Left Hip block with Dr. Daisy Lazar Long Short Stay, Room 11   05/08/22 1356  Time-Out  What Procedure? Left Hip Block  Pt Identifiers(min of two) First/Last Name;Pt's DOB(use if MRN/Acct# not available  Correct Site? Yes  Site Marked? Yes  Correct Side? Yes  Correct Patient Position? Yes  Correct Procedure? Yes  Antibiotics Ordered/Given? N/A  Consents Verified? Yes  Rad Studies Available? Yes (CT of left hip)  Lab Results Available? Yes (normal plt level.)  Safety Precautions Reviewed? Yes

## 2022-05-08 NOTE — Discharge Instructions (Signed)
High-Calorie, High-Protein Nutrition Therapy (2021) A high-calorie, high-protein diet has been recommended to you. Your registered dietitian nutritionist (RDN) may have recommended this diet because you are having difficulty eating enough calories throughout the day, you have lost weight, and/or you need to add protein to your diet. Sometimes you may not feel like eating, even if you know the importance of good nutrition. The recommendations in this handout can help you with the following: Regaining your strength and energy Keeping your body healthy Healing and recovering from surgery or illness and fighting infection Tips: Schedule Your Meals and Snacks Several small meals and snacks are often better tolerated and digested than large meals. Strategies Plan to eat 3 meals and 3 snacks daily. Experiment with timing meals to find out when you have a larger appetite. Appetite may be greatest in the morning after not eating all night so you may prefer to eat your larger meals and snacks in the morning and at lunch. Breakfast-type foods are often better tolerated so eat foods such as eggs, pancakes, waffles and cereal for any meal or snack. Carry snacks with you so you are prepared to eat every 2 to 3 hours. Determine what works best for you if your body's cues for feeling hungry or full are not working. Eat a small meal or snack even if you don't feel hungry. Set a timer to remind you when it is time to eat. Take a walk before you eat (with health care provider's approval). Light or moderate physical activity can help you maintain muscle and increase your appetite. Make Eating Enjoyable Taking steps to make the experience enjoyable may help to increase your interest in eating and improve your appetite. Strategies: Eat with others whenever possible. Include your favorite foods to make meals more enjoyable. Try new foods. Save your beverage for the end of the meal so that you have more room for  food before you get full. Add Calories to Your Meals and Snacks Try adding calorie-dense foods so that each bite provides more nutrition. Strategies Drink milk, chocolate milk, soy milk, or smoothies instead of low-calorie beverages such as diet drinks or water. Cook with milk or soy milk instead of water when making dishes such as hot cereal, cocoa, or pudding. Add jelly, jam, honey, butter or margarine to bread and crackers. Add jam or fruit to ice cream and as a topping over cake. Mix dried fruit, nuts, granola, honey, or dry cereal with yogurt or hot cereals. Enjoy snacks such as milkshakes, smoothies, pudding, ice cream, or custard. Blend a fruit smoothie of a banana, frozen berries, milk or soy milk, and 1 tablespoon nonfat powdered milk or protein powder. Add Protein to Your Meals and Snacks Choose at least one protein food at each meal and snack to increase your daily intake. Strategies Add  cup nonfat dry milk powder or protein powder to make a high-protein milk to drink or to use in recipes that call for milk. Vanilla or peppermint extract or unsweetened cocoa powder could help to boost the flavor. Add hard-cooked eggs, leftover meat, grated cheese, canned beans or tofu to noodles, rice, salads, sandwiches, soups, casseroles, pasta, tuna and other mixed dishes. Add powdered milk or protein powder to hot cereals, meatloaf, casseroles, scrambled eggs, sauces, cream soups, and shakes. Add beans and lentils to salads, soups, casseroles, and vegetable dishes. Eat cottage cheese or yogurt, especially Greek yogurt, with fruit as a snack or dessert. Eat peanut or other nut butters on crackers, bread, toast,  waffles, apples, bananas or celery sticks. Add it to milkshakes, smoothies, or desserts. Consider a ready-made protein shake. Your RDN will make recommendations. Add Fats to Your Meals and Snacks Try adding fats to your meals and snacks. Fat provides more calories in fewer bites than  carbohydrate or protein and adds flavors to your foods. Strategies Snack on nuts and seeds or add them to foods like salads, pasta, cereals, yogurt, and ice cream.  Saut or stir-fry vegetables, meats, chicken, fish or tofu in olive or canola oil.  Add olive oil, other vegetable oils, butter or margarine to soups, vegetables, potatoes, cooked cereal, rice, pasta, bread, crackers, pancakes, or waffles. Snack on olives or add to pasta, pizza, or salad. Add avocado or guacamole to your salads, sandwiches, and other entrees. Include fatty fish such as salmon in your weekly meal plan. For general food safety tips, especially for clients with immunocompromised conditions, ask your RDN for the Food Safety Nutrition Therapy handout. Small Meal and Snack Ideas These snacks and meals are recommended when you have to eat but aren't necessarily hungry.  They are good choices because they are high in protein and high in calories.  2 graham crackers 2 tablespoons peanut or other nut butter 1 cup milk 2 slices whole wheat toast topped with:  avocado, mashed Seasoning of your choice   cup Greek yogurt  cup fruit  cup granola 2 deviled egg halves 5 whole wheat crackers  1 cup cream of tomato soup  grilled cheese sandwich 1 toasted waffle topped with: 2 tablespoons peanut or nut butter 1 tablespoon jam  Trail mix made with:  cup nuts  cup dried fruit  cup cold cereal, any variety  cup oatmeal or cream of wheat cereal 1 tablespoon peanut or nut butter  cup diced fruit    INSTRUCTIONS AFTER JOINT REPLACEMENT   Remove items at home which could result in a fall. This includes throw rugs or furniture in walking pathways ICE to the affected joint every three hours while awake for 30 minutes at a time, for at least the first 3-5 days, and then as needed for pain and swelling.  Continue to use ice for pain and swelling. You may notice swelling that will progress down to the foot and ankle.  This is  normal after surgery.  Elevate your leg when you are not up walking on it.   Continue to use the breathing machine you got in the hospital (incentive spirometer) which will help keep your temperature down.  It is common for your temperature to cycle up and down following surgery, especially at night when you are not up moving around and exerting yourself.  The breathing machine keeps your lungs expanded and your temperature down.   DIET:  As you were doing prior to hospitalization, we recommend a well-balanced diet.  DRESSING / WOUND CARE / SHOWERING  Keep the surgical dressing until follow up.  The dressing is water proof, so you can shower without any extra covering.  IF THE DRESSING FALLS OFF or the wound gets wet inside, change the dressing with sterile gauze.  Please use good hand washing techniques before changing the dressing.  Do not use any lotions or creams on the incision until instructed by your surgeon.    ACTIVITY  Increase activity slowly as tolerated, but follow the weight bearing instructions below.   No driving for 6 weeks or until further direction given by your physician.  You cannot drive while taking narcotics.  No lifting or carrying greater than 10 lbs. until further directed by your surgeon. Avoid periods of inactivity such as sitting longer than an hour when not asleep. This helps prevent blood clots.  You may return to work once you are authorized by your doctor.     WEIGHT BEARING   Weight bearing as tolerated with assist device (walker, cane, etc) as directed, use it as long as suggested by your surgeon or therapist, typically at least 4-6 weeks.   EXERCISES  Results after joint replacement surgery are often greatly improved when you follow the exercise, range of motion and muscle strengthening exercises prescribed by your doctor. Safety measures are also important to protect the joint from further injury. Any time any of these exercises cause you to have  increased pain or swelling, decrease what you are doing until you are comfortable again and then slowly increase them. If you have problems or questions, call your caregiver or physical therapist for advice.   Rehabilitation is important following a joint replacement. After just a few days of immobilization, the muscles of the leg can become weakened and shrink (atrophy).  These exercises are designed to build up the tone and strength of the thigh and leg muscles and to improve motion. Often times heat used for twenty to thirty minutes before working out will loosen up your tissues and help with improving the range of motion but do not use heat for the first two weeks following surgery (sometimes heat can increase post-operative swelling).   These exercises can be done on a training (exercise) mat, on the floor, on a table or on a bed. Use whatever works the best and is most comfortable for you.    Use music or television while you are exercising so that the exercises are a pleasant break in your day. This will make your life better with the exercises acting as a break in your routine that you can look forward to.   Perform all exercises about fifteen times, three times per day or as directed.  You should exercise both the operative leg and the other leg as well.  Exercises include:   Quad Sets - Tighten up the muscle on the front of the thigh (Quad) and hold for 5-10 seconds.   Straight Leg Raises - With your knee straight (if you were given a brace, keep it on), lift the leg to 60 degrees, hold for 3 seconds, and slowly lower the leg.  Perform this exercise against resistance later as your leg gets stronger.  Leg Slides: Lying on your back, slowly slide your foot toward your buttocks, bending your knee up off the floor (only go as far as is comfortable). Then slowly slide your foot back down until your leg is flat on the floor again.  Angel Wings: Lying on your back spread your legs to the side as far  apart as you can without causing discomfort.  Hamstring Strength:  Lying on your back, push your heel against the floor with your leg straight by tightening up the muscles of your buttocks.  Repeat, but this time bend your knee to a comfortable angle, and push your heel against the floor.  You may put a pillow under the heel to make it more comfortable if necessary.   A rehabilitation program following joint replacement surgery can speed recovery and prevent re-injury in the future due to weakened muscles. Contact your doctor or a physical therapist for more information on knee rehabilitation.  CONSTIPATION  Constipation is defined medically as fewer than three stools per week and severe constipation as less than one stool per week.  Even if you have a regular bowel pattern at home, your normal regimen is likely to be disrupted due to multiple reasons following surgery.  Combination of anesthesia, postoperative narcotics, change in appetite and fluid intake all can affect your bowels.   YOU MUST use at least one of the following options; they are listed in order of increasing strength to get the job done.  They are all available over the counter, and you may need to use some, POSSIBLY even all of these options:    Drink plenty of fluids (prune juice may be helpful) and high fiber foods Colace 100 mg by mouth twice a day  Senokot for constipation as directed and as needed Dulcolax (bisacodyl), take with full glass of water  Miralax (polyethylene glycol) once or twice a day as needed.  If you have tried all these things and are unable to have a bowel movement in the first 3-4 days after surgery call either your surgeon or your primary doctor.    If you experience loose stools or diarrhea, hold the medications until you stool forms back up.  If your symptoms do not get better within 1 week or if they get worse, check with your doctor.  If you experience "the worst abdominal pain ever" or develop  nausea or vomiting, please contact the office immediately for further recommendations for treatment.   ITCHING:  If you experience itching with your medications, try taking only a single pain pill, or even half a pain pill at a time.  You can also use Benadryl over the counter for itching or also to help with sleep.   TED HOSE STOCKINGS:  Use stockings on both legs until for at least 2 weeks or as directed by physician office. They may be removed at night for sleeping.  MEDICATIONS:  See your medication summary on the "After Visit Summary" that nursing will review with you.  You may have some home medications which will be placed on hold until you complete the course of blood thinner medication.  It is important for you to complete the blood thinner medication as prescribed.  PRECAUTIONS:  If you experience chest pain or shortness of breath - call 911 immediately for transfer to the hospital emergency department.   If you develop a fever greater that 101 F, purulent drainage from wound, increased redness or drainage from wound, foul odor from the wound/dressing, or calf pain - CONTACT YOUR SURGEON.                                                   FOLLOW-UP APPOINTMENTS:  If you do not already have a post-op appointment, please call the office for an appointment to be seen by your surgeon.  Guidelines for how soon to be seen are listed in your "After Visit Summary", but are typically between 1-4 weeks after surgery.  OTHER INSTRUCTIONS:   Knee Replacement:  Do not place pillow under knee, focus on keeping the knee straight while resting. CPM instructions: 0-90 degrees, 2 hours in the morning, 2 hours in the afternoon, and 2 hours in the evening. Place foam block, curve side up under heel at all times except when in CPM or when  walking.  DO NOT modify, tear, cut, or change the foam block in any way.  POST-OPERATIVE OPIOID TAPER INSTRUCTIONS: It is important to wean off of your opioid medication as  soon as possible. If you do not need pain medication after your surgery it is ok to stop day one. Opioids include: Codeine, Hydrocodone(Norco, Vicodin), Oxycodone(Percocet, oxycontin) and hydromorphone amongst others.  Long term and even short term use of opiods can cause: Increased pain response Dependence Constipation Depression Respiratory depression And more.  Withdrawal symptoms can include Flu like symptoms Nausea, vomiting And more Techniques to manage these symptoms Hydrate well Eat regular healthy meals Stay active Use relaxation techniques(deep breathing, meditating, yoga) Do Not substitute Alcohol to help with tapering If you have been on opioids for less than two weeks and do not have pain than it is ok to stop all together.  Plan to wean off of opioids This plan should start within one week post op of your joint replacement. Maintain the same interval or time between taking each dose and first decrease the dose.  Cut the total daily intake of opioids by one tablet each day Next start to increase the time between doses. The last dose that should be eliminated is the evening dose.   MAKE SURE YOU:  Understand these instructions.  Get help right away if you are not doing well or get worse.    Thank you for letting us be a part of your medical care team.  It is a privilege we respect greatly.  We hope these instructions will help you stay on track for a fast and full recovery!     Dental Antibiotics:  In most cases prophylactic antibiotics for Dental procdeures after total joint surgery are not necessary.  Exceptions are as follows:  1. History of prior total joint infection  2. Severely immunocompromised (Organ Transplant, cancer chemotherapy, Rheumatoid biologic meds such as Humera)  3. Poorly controlled diabetes (A1C &gt; 8.0, blood glucose over 200)  If you have one of these conditions, contact your surgeon for an antibiotic prescription, prior to  your dental procedure.

## 2022-05-08 NOTE — ED Notes (Signed)
Report given to [place], Charity fundraiser. Awaiting [facility] for transportation.

## 2022-05-08 NOTE — Consult Note (Signed)
Chief Complaint: left hip pain  HPI: Cheryl Bullock is a 82 y.o. 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  CT scan of the left hip was performed showing a femoral neck fracture she was transferred to [facility] for observation and orthopedic consultation.  She states pain at the left hip is severe, worse with movement and better with rest.  She does not walk with an assistive device at baseline.  She is not on a blood thinner.  She lives alone and would prefer to discharge to be [facility] inpatient rehab after surgery if possible.  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   TOTAL ABDOMINAL HYSTERECTOMY  01/20/1979   bilat oophorectomy   Social History   Socioeconomic History   Marital status: Widowed    Spouse name: Not on file   Number of children: Not on file   Years of education: Not on file   Highest education level: Not on file  Occupational History   Not on file  Tobacco Use   Smoking status: Never   Smokeless tobacco: Never  Vaping Use   Vaping Use: Never used  Substance and Sexual Activity   Alcohol use: Yes    Alcohol/week: 1.0 standard drink of alcohol    Types: 1 Standard drinks or equivalent per week    Comment: 1drink a month(vodka)   Drug use: No   Sexual activity: Not Currently    Partners: Male    Birth control/protection: Surgical    Comment: TAH  Other Topics Concern   Not on file  Social History Narrative   Not on  file   Social Determinants of Health   Financial Resource Strain: Not on file  Food Insecurity: Not on file  Transportation Needs: Not on file  Physical Activity: Not on file  Stress: Not on file  Social Connections: Not on file   Family History  Problem Relation Age of Onset   Cancer Mother        pancreatic cancer   Osteoporosis Mother    Dementia Father    Cancer Maternal Aunt        pancreatic cancer   Allergies  Allergen Reactions   Clarithromycin Other (See Comments)    Horrible taste     Positive ROS: All other systems have been reviewed and were otherwise negative with the exception of those mentioned in the HPI and as above.  Physical Exam: General: Alert, no acute distress Cardiovascular: No pedal edema Respiratory: No cyanosis, no use of accessory musculature GI: No organomegaly, abdomen is soft and non-tender Skin: No lesions in the area of chief complaint Neurologic: Sensation intact distally Psychiatric: Patient is competent for consent with normal mood and affect Lymphatic: No axillary or cervical lymphadenopathy  MUSCULOSKELETAL: Left lower extremity shortened and externally rotated.  Dorsiflexion plantarflexion intact to the ankle.  Distal sensation intact to all aspects of the left  foot.  2+ DP pulse.  Assessment: Left femoral neck fracture with pre-existing left hip osteoarthritis -Patient originally asked for [facility] to be involved in her care, Dr. Rennis Chris was consulted last night, however there are no [facility] hip surgeons available today or tomorrow in order to perform her surgery.  Plan is for Dr. Magnus Ivan to perform a total hip arthroplasty either later this afternoon or first thing tomorrow morning. -Plan to keep her n.p.o. today, we will allow her to eat until midnight if no plan for surgery this evening. -Nonweightbearing left lower extremity  Janine Ores, PA-C  05/08/2022 1:51 PM

## 2022-05-08 NOTE — Progress Notes (Signed)
Plan of Care Note for accepted transfer   Patient: Cheryl Bullock MRN: [id number]   DOA: 05/07/2022  Facility requesting transfer: [facility] ED Requesting Provider: Velda Shell, PA-EDP Reason for transfer: Left femur fracture post mechanical fall Facility course: The patient is a 82 year old female with past medical history significant for hypothyroidism, hyperlipidemia, migraines, who presented to [facility] ED after a mechanical fall.  She stumbled in her home and fell forward, landed on her left hip.  In the ED, left femur x-ray revealed the following:  Cortical offset of the left femoral neck suspicious for nondisplaced fracture. CT scan requested by orthopedic surgery revealed the following: 1. Acute transverse subcapital fracture of the proximal left femur with impaction and with slight anterior translation of the main distal fragment. 2. No fracture of the acetabulum, of the left pelvic ring or the visualized portion of the left hemisacrum. 3. Osteopenia and degenerative change. 4. Moderate subcutaneous stranding in the lateral left hip area at the level of the greater trochanter. No space-occupying hematoma.  EDP discussed the case with Dr. Rennis Chris from [facility] who recommended admission to [facility].  N.p.o. after midnight for possible surgical intervention.  Admitted at [facility] MedSurg unit as observation status.    Plan of care: The patient is accepted for admission to Med-surg  unit, at [facility].   Author: Darlin Drop, DO 05/08/2022  Check [email or web address] for on-call coverage.  Nursing staff, Please call [facility] Admits & Consults System-Wide number on Amion as soon as patient's arrival, so appropriate admitting provider can evaluate the pt.

## 2022-05-08 NOTE — ED Notes (Signed)
Patient requesting pain medicine, informed PA Soijett. States multiple nurses have alerted her to patient's pain medicine request and she has already spoken to them. Awaiting new orders.

## 2022-05-08 NOTE — Transfer of Care (Signed)
Immediate Anesthesia Transfer of Care Note  Patient: PRABHNOOR ELLENBERGER  Procedure(s) Performed: TOTAL HIP ARTHROPLASTY ANTERIOR APPROACH (Left: Hip)  Patient Location: PACU  Anesthesia Type:Spinal  Level of Consciousness: sedated and responds to stimulation  Airway & Oxygen Therapy: Patient Spontanous Breathing and Patient connected to face mask oxygen  Post-op Assessment: Report given to RN and Post -op Vital signs reviewed and stable  Post vital signs: Reviewed and stable  Last Vitals:  Vitals Value Taken Time  BP 116/66 05/08/22 2053  Temp    Pulse 57 05/08/22 2053  Resp 12 05/08/22 2053  SpO2 100 % 05/08/22 2053  Vitals shown include unvalidated device data.  Last Pain:  Vitals:   05/08/22 2045  TempSrc:   PainSc: (P) Asleep         Complications: No notable events documented.

## 2022-05-08 NOTE — ED Notes (Signed)
Patient resting quietly in stretcher, respirations even, unlabored, no acute distress noted. Denies needs at this time.  

## 2022-05-08 NOTE — Progress Notes (Signed)
Patient ID: Cheryl Bullock, female   DOB: [birth year], 82 y.o.   MRN: [id number] The patient is a very pleasant 82 year old female who unfortunately sustained an accidental mechanical fall yesterday injuring her left hip.  She was found to have a minimally displaced left femoral neck fracture.  She has been having hip pain for some time and a year ago or more had an intra-articular steroid injection in that left hip.  She is a regular patient of [facility] but none of their providers are available for addressing her left hip fracture.  I been asked to assume her orthopedic care.  I did speak to her in length at the bedside about her fracture and treatment options.  We talked about the risks and benefits of surgery.  Given the fact that she has arthritis already and that left hip, we are recommending a left total hip arthroplasty.  She has had a temporary pain block of that left hip earlier today by Dr. Jean Rosenthal from anesthesia.  We will proceed to surgery this evening.  Informed consent has been obtained and the left operative hip has been marked.

## 2022-05-08 NOTE — ED Notes (Signed)
This RN, Consulting civil engineer [place] and PA Soijett to bedside to speak with patient and visitor. PA states she does not give stronger narcotics to older patients but that she can switch the fentanyl order to percocet as patient states the fentanyl did not help her pain. Patient agrees to this change.

## 2022-05-08 NOTE — ED Notes (Signed)
PA Soijett notified patient is requesting medication for when the pain returns as well as colace. No new orders at this time.

## 2022-05-08 NOTE — Anesthesia Postprocedure Evaluation (Signed)
Anesthesia Post Note  Patient: Cheryl Bullock  Procedure(s) Performed: TOTAL HIP ARTHROPLASTY ANTERIOR APPROACH (Left: Hip)     Patient location during evaluation: PACU Anesthesia Type: Spinal Level of consciousness: oriented, awake and alert and patient cooperative Pain management: pain level controlled Vital Signs Assessment: post-procedure vital signs reviewed and stable Respiratory status: spontaneous breathing, respiratory function stable and nonlabored ventilation Cardiovascular status: blood pressure returned to baseline and stable Postop Assessment: no headache, no backache, no apparent nausea or vomiting, spinal receding and patient able to bend at knees Anesthetic complications: no   No notable events documented.  Last Vitals:  Vitals:   05/08/22 2120 05/08/22 2130  BP: 132/64 131/65  Pulse: 63 64  Resp: 16 17  Temp:    SpO2: 100% 100%    Last Pain:  Vitals:   05/08/22 2130  TempSrc:   PainSc: 0-No pain                 Cheryl Bullock,E. Rogers Ditter

## 2022-05-08 NOTE — Anesthesia Procedure Notes (Signed)
Spinal  Patient location during procedure: OR End time: 05/08/2022 7:17 PM Reason for block: surgical anesthesia Staffing Performed: anesthesiologist  Anesthesiologist: Jairo Ben, MD Performed by: Jairo Ben, MD Authorized by: Jairo Ben, MD   Preanesthetic Checklist Completed: patient identified, IV checked, site marked, risks and benefits discussed, surgical consent, monitors and equipment checked, pre-op evaluation and timeout performed Spinal Block Patient position: sitting Prep: DuraPrep and site prepped and draped Patient monitoring: heart rate, cardiac monitor, continuous pulse ox and blood pressure Approach: midline Location: L3-4 Injection technique: single-shot Needle Needle type: Pencan and Introducer  Needle gauge: 24 G Needle length: 9 cm Assessment Events: CSF return Additional Notes Pt identified in Operating room.  Monitors applied. Working IV access confirmed. Sterile prep, drape lumbar spine.  1% lido local L 3,4.  #24ga Pencan into clear CSF L 3,4.   0.75% Bupivacaine with dextrose injected with asp CSF beginning and end of injection.  Patient asymptomatic, VSS, no heme aspirated, tolerated well.  Sandford Craze, MD

## 2022-05-08 NOTE — TOC Initial Note (Signed)
Transition of Care [facility]) - Initial/Assessment Note   Patient Details  Name: Cheryl Bullock MRN: [id number] Date of Birth: 05-01-1940  Transition of [facility] CM/SW Contact:    Ewing Schlein, LCSW Phone Number: 05/08/2022, 11:32 AM  Clinical Narrative: [facility] consulted for possible SNF/HH needs. Following for discharge planning.  Expected Discharge Plan: Skilled Nursing Facility Barriers to Discharge: Continued Medical Work up  Patient Goals and CMS Choice Patient states their goals for this hospitalization and ongoing recovery are::  (Pending surgery and PT evaluation)  Expected Discharge Plan and Services In-house Referral: Clinical Social Work Living arrangements for the past 2 months: Single Family Home  Prior Living Arrangements/Services Living arrangements for the past 2 months: Single Family Home Patient language and need for interpreter reviewed:: Yes Need for Family Participation in Patient Care: No (Comment) Care giver support system in place?: Yes (comment) Criminal Activity/Legal Involvement Pertinent to Current Situation/Hospitalization: No - Comment as needed  Emotional Assessment Alcohol / Substance Use: Not Applicable Psych Involvement: No (comment)  Admission diagnosis:  Closed left femoral fracture [S72.92XA] Fall, initial encounter [W19.XXXA] Closed fracture of neck of left femur, initial encounter [S72.002A] Patient Active Problem List   Diagnosis Date Noted   Closed left femoral fracture 05/07/2022   Age-related osteoporosis without current pathological fracture 10/25/2021   Primary osteoarthritis of first carpometacarpal joint of left hand 03/25/2016   GERD (gastroesophageal reflux disease) 05/10/2014   Hyperlipidemia 05/10/2014   Hypothyroid 05/10/2014   Hx of migraine headaches 05/10/2014   PCP:  Gaspar Garbe, MD Pharmacy:   [id number] [place], [place] - [address] AT [facility] [address] [facility] [address] [place]  [place] [address] Phone: [phone] Fax: [phone]  [facility] [address] - [place], [place] - [address] [facility] [address] [place] [address] Phone: [phone] Fax: [phone]  [facility] - [place], [place] - [address] [place] [address] Phone: [phone] Fax: [phone]  Social Determinants of Health (SDOH) Social History: SDOH Screenings   Tobacco Use: Low Risk  (05/07/2022)   SDOH Interventions:    Readmission Risk Interventions     No data to display

## 2022-05-08 NOTE — H&P (Signed)
History and Physical    Patient: Cheryl Bullock ZOX:[id number] DOB: May 31, 1940 DOA: 05/07/2022 DOS: the patient was seen and examined on 05/08/2022 PCP: Gaspar Garbe, MD  Patient coming from: Home  Chief Complaint:  Chief Complaint  Patient presents with   Fall   HPI: Cheryl Bullock is a 82 y.o. female with medical history significant of hip arthritis, hyperlipidemia, endometriosis, fibroid, GERD, temporary global admission, impaired hearing, migraine headaches, osteoporosis, scoliosis, hypothyroidism who was transferred from [facility] emergency department after presenting there with a fall injuring her left hip with immediate pain and inability to bear weight on her LLE.  No fever, chills or night sweats. No sore throat, rhinorrhea, dyspnea, wheezing or hemoptysis.  No chest pain, palpitations, diaphoresis, PND, orthopnea or pitting edema of the lower extremities.  No appetite changes, abdominal pain, diarrhea, constipation, melena or hematochezia.  No flank pain, dysuria, frequency or hematuria.  No polyuria, polydipsia, polyphagia or blurred vision.  ED course: Initial vital signs were temperature 98.1 F, pulse 73, respiration 18, BP 113/69 mmHg O2 sat 100% on room air.  Patient received 1 tablet of Percocet 5/325 mg p.o. and morphine 2 mg IVP x 2.  Lab work: CBC showed a white count of 15.8, hemoglobin 13.6 g/dL platelets 811.  BMP with a glucose of 101 and BUN of 25 mg/deciliter.  Creatinine and electrolytes were normal.  Imaging: Pelvics x-ray and left femur x-ray showed cortical offset of the left femoral neck suspicious for nondisplaced fracture.  Left hip CT scan without contrast showing acute transverse subcapital fracture of the proximal left femur with impaction and slight anterior translation of the main distal fragment.  No fracture of the acetabulum, of the pelvic ring or visualized portions of the left hemisacrum.  There is osteopenia and degenerative change.  Moderate subcutaneous  stranding in the lateral left hip area at the level of the greater trochanter.  No space-occupying hematoma.   Review of Systems: As mentioned in the history of present illness. All other systems reviewed and are negative. 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 She has never used smokeless tobacco. She reports current alcohol use of about 1.0 standard drink of alcohol per week. She reports that she does not use drugs.  Allergies  Allergen Reactions   Clarithromycin Other (See Comments)    Horrible taste    Family History  Problem Relation Age of Onset   Cancer Mother        pancreatic cancer   Osteoporosis Mother    Dementia Father    Cancer Maternal Aunt        pancreatic cancer    Prior to Admission medications   Medication Sig Start Date End Date Taking? Authorizing Provider  acetaminophen (TYLENOL) 500 MG tablet Take 1,000 mg by mouth 2 (two) times daily.    [provider]  aspirin 81 MG tablet Take 81 mg by mouth daily.    [provider]  Calcium Carb-Cholecalciferol 600-500 MG-UNIT  CAPS Take by mouth. Takes 1/2 tablet daily    [provider]  Cholecalciferol (VITAMIN D3) 2000 UNITS capsule Take 1 capsule by mouth daily.    [provider]  glucosamine-chondroitin 500-400 MG tablet Take 1 tablet by mouth 2 (two) times daily.    [provider]  levothyroxine (SYNTHROID, LEVOTHROID) 75 MCG tablet Take 1 tablet daily by mouth. 01/16/16   [provider]  meloxicam (MOBIC) 7.5 MG tablet     [provider]  Multiple  Vitamin (MULTIVITAMIN) capsule Take 1 capsule by mouth daily.    [provider]  rizatriptan (MAXALT) 10 MG tablet Take 1 tablet by mouth 3 (three) times daily as needed.    [provider]  simvastatin (ZOCOR) 5 MG tablet Take 1 tablet by mouth daily.    [provider]    Physical Exam: Vitals:   05/08/22 0715 05/08/22 0730 05/08/22 0740 05/08/22 0824  BP: (!) 122/59 (!) 112/59  (!) 131/50  Pulse: 69 65  74  Resp:    (P) 16  Temp:   98.2 F (36.8 C) 98.8 F (37.1 C)  TempSrc:   Oral Oral  SpO2: 93% 94%  99%  Weight:      Height:       Physical Exam Vitals and nursing note reviewed.  Constitutional:      Appearance: Normal appearance.  HENT:     Head: Normocephalic.     Mouth/Throat:     Mouth: Mucous membranes are moist.  Eyes:     General: No scleral icterus.    Pupils: Pupils are equal, round, and reactive to light.  Cardiovascular:     Rate and Rhythm: Normal rate and regular rhythm.  Pulmonary:     Effort: Pulmonary effort is normal.     Breath sounds: Normal breath sounds.  Abdominal:     General: Bowel sounds are normal. There is no distension.     Palpations: Abdomen is soft.     Tenderness: There is no abdominal tenderness. There is no guarding.  Musculoskeletal:     Cervical back: Neck supple.     Left hip: Tenderness present. Decreased range of motion.     Right lower leg: No edema.     Left lower leg: No edema.  Skin:    General: Skin is warm and dry.  Neurological:     General: No focal deficit present.     Mental Status: She is alert and oriented to person, place, and time.  Psychiatric:        Mood and Affect: Mood normal.        Behavior: Behavior normal.    Data Reviewed:  Assessment and Plan: Principal Problem:   Closed left femoral fracture Admit to telemetry/inpatient. Ice area as needed. Buck's traction per protocol. Analgesics as needed. Antiemetics as needed. Consult TOC team. Consult nutritional  services. PT evaluation after surgery. Orthopedic surgery evaluation appreciated.  Active Problems:   GERD (gastroesophageal reflux disease) PPI prophylaxis while in the hospital.    Hyperlipidemia Continue simvastatin 5 mg p.o. daily.    Hypothyroid Continue levothyroxine 75 mcg p.o. daily.    Hx of migraine headaches Rarely needs to use Maxalt.     Advance Care Planning:   Code Status: Full Code   Consults: Orthopedic surgery Raechel Chute)  Family Communication:   Severity of Illness: The appropriate patient status for this patient is INPATIENT. Inpatient status is judged to be reasonable and necessary in order to provide the  required intensity of service to ensure the patient's safety. The patient's presenting symptoms, physical exam findings, and initial radiographic and laboratory data in the context of their chronic comorbidities is felt to place them at high risk for further clinical deterioration. Furthermore, it is not anticipated that the patient will be medically stable for discharge from the hospital within 2 midnights of admission.   * I certify that at the point of admission it is my clinical judgment that the patient will require inpatient hospital care spanning beyond 2 midnights from the point of admission due to high intensity of service, high risk for further deterioration and high frequency of surveillance required.*  Author: Bobette Mo, MD 05/08/2022 9:07 AM  For on call review [email or web address].   This document was prepared using Dragon voice recognition software and may contain some unintended transcription errors.

## 2022-05-08 NOTE — Op Note (Signed)
Operative Note  Date of operation: 05/08/2022 Preoperative diagnosis: Left hip subcapital femoral neck fracture Postoperative diagnosis: Same  Procedure: Left direct anterior total hip arthroplasty  Implants: Implant Name Type Inv. Item Serial No. Manufacturer Lot No. LRB No. Used Action  PINNACLE PLUS 4 NEUTRAL - [id number] Hips PINNACLE PLUS 4 NEUTRAL  [employer]PU[employer] [id number] Left 1 Implanted  CUP SECTOR GRIPTON - [id number] Cup CUP SECTOR GRIPTON  [employer]PU[employer]TH[employer] [id number] Left 1 Implanted  STEM CORAIL KA11 - [id number] Stem STEM CORAIL KA11  DEPU[employer] ORTH[employer] [id number] Left 1 Implanted  HEAD FEM STD 32X+1 STRL - [id number] Hips HEAD FEM STD 32X+1 STRL  DEPUY ORTHOPA[employer] [id number] Left 1 Implanted   Surgeon: Vanita Panda. Magnus Ivan, MD Assistant: Rexene Edison, PA-C  Anesthesia: Spinal EBL: 300 cc Antibiotics: 2 g IV Ancef Complications: None  Indications: The patient is an 82 year old female who sustained a mechanical fall yesterday at home that was an accidental fall.  There was no loss of consciousness.  She was seen at one of the med centers and found to have a femoral neck fracture.  She was then transferred to [facility] for definitive treatment of this fracture.  She was graciously admitted to the medicine service.  She does have pre-existing arthritis in her left hip and has been seen by one of the other orthopedic groups in town.  They are not available for definitive treatment of this fracture.  We have recommended a total hip arthroplasty given the fact that she has a femoral neck fracture combined with the arthritis that she already had in her hip.  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  She has had a steroid injection before in that hip remotely.  We had a long thorough discussion about the risk and benefits of surgery.  We talked about the risk of acute blood loss anemia, nerve or vessel injury, fracture, infection, DVT,  dislocation, implant failure, leg length differences and wound healing issues.  She understands her goals are hopefully decrease pain, improve mobility, and improve quality of life.  Procedure description: After informed consent was obtained and the appropriate left hip was marked, the patient was brought to the operating room and kept on the stretcher where spinal anesthesia was obtained.  A Foley catheter already been placed.  She was then placed supine on the Hana fracture table with a perineal post in place in both legs and inline skeletal traction vices but no traction applied.  Her left operative hip was prepped and draped with DuraPrep and sterile drapes.  Timeout is called and she was then applied as correct patient correct the left hip.  An incision was then made just inferior and posterior the ASIS and carried slightly obliquely down the leg.  Dissection was carried down the tensor fascia lata muscle and the tensor fascia was then divided longitudinally to proceed with a direct interposed the hip.  Circumflex vessels were identified and cauterized.  The hip capsule identified and opened up in L-type format finding and hemarthrosis and a femoral neck fracture.  Cobra retractors were placed around the medial and lateral femoral neck and a freshening femoral neck cut was made distal to the fracture but proximal to the lesser trochanter.  This was completed with an osteotome.  The cut was made with an oscillating saw.  The femoral head was then removed in its entirety and there was an area of significant arthritis.  A bent Hohmann was then placed  over the medial acetabular rim and remnants of the acetabular labrum and other debris removed.  Reaming was then initiated under direct visualization from a size 43 reamer and stepwise increments going up to a size 49 reamer with all reamers placed under direct visualization and the last reamer placed under direct fluoroscopy as well in order to gain the depth of  reaming, the inclination and the anteversion.  The real DePuy sector GRIPTION acetabular opponent size 50 was then placed without difficulty and given medialization of the acetabular component and offset we placed a 32+4 polythene liner.  Attention was then turned to the femur.  With the left leg externally rotated to 130 degrees, extended and adducted, a Mueller retractor was placed medially by the medial calcar and a Hohmann retractor was placed behind the greater trochanter.  The lateral joint capsule was released and a box cutting osteotome was used into the femoral canal.  Broaching was then initiated using the Corail system from a size 8 going to a size 11.  With a size 11 in place we trialed a standard offset femoral neck and a 32+1 trial hip ball.  The leg was brought over and up and with traction and internal rotation reduced in the pelvis and we are pleased with leg length, offset, range of motion and stability assessed mechanically and radiographically.  We then dislocated the hip and remove the trial components.  We placed the real size 11 femoral component with standard offset and the real 32+1 metal head ball and again reduces in the acetabulum and once again we are pleased with stability assessing this mechanically and radiographically.  The soft tissue was then irrigated with normal saline solution.  The joint capsule was closed with interrupted #1 Ethibond suture followed by #1 Vicryl close the tensor fascia.  0 Vicryl is used to close the deep tissue and 2-0 Vicryl is used to close subcutaneous tissue.  The skin was closed with staples.  An Aquacel dressing was applied.  The patient was taken off the Hana table and taken to recovery room in stable addition.  Rexene Edison, PA-C did assist in the entire case and beginning in and his assistance was crucial and medically necessary for soft tissue management and retraction, helping guide implant placement and a layered closure of the wound.

## 2022-05-08 NOTE — Anesthesia Preprocedure Evaluation (Addendum)
Anesthesia Evaluation  Patient identified by MRN, date of birth, ID band Patient awake    Reviewed: Allergy & Precautions, NPO status , Patient's Chart, lab work & pertinent test results  History of Anesthesia Complications Negative for: history of anesthetic complications  Airway Mallampati: II  TM Distance: >3 FB Neck ROM: Full    Dental  (+) Dental Advisory Given, Caps   Pulmonary neg pulmonary ROS   breath sounds clear to auscultation       Cardiovascular negative cardio ROS  Rhythm:Regular Rate:Normal     Neuro/Psych  Headaches    GI/Hepatic Neg liver ROS,GERD  Controlled,,  Endo/Other  Hypothyroidism    Renal/GU negative Renal ROS     Musculoskeletal  (+) Arthritis ,  Left Hip Fx   Abdominal   Peds  Hematology negative hematology ROS (+)   Anesthesia Other Findings   Reproductive/Obstetrics                             Anesthesia Physical Anesthesia Plan  ASA: 2  Anesthesia Plan: Regional   Post-op Pain Management:    Induction:   PONV Risk Score and Plan: Treatment may vary due to age or medical condition  Airway Management Planned: Natural Airway  Additional Equipment: None  Intra-op Plan:   Post-operative Plan:   Informed Consent: I have reviewed the patients History and Physical, chart, labs and discussed the procedure including the risks, benefits and alternatives for the proposed anesthesia with the patient or authorized representative who has indicated his/her understanding and acceptance.       Plan Discussed with: CRNA and Surgeon  Anesthesia Plan Comments: (Plan PENG block for analgesia)       Anesthesia Quick Evaluation

## 2022-05-08 NOTE — ED Notes (Signed)
Patient declining fentanyl as she states it did not help her pain much the first time, requesting to speak with PA. PA and charge RN notified.

## 2022-05-08 NOTE — Progress Notes (Signed)
Initial Nutrition Assessment  INTERVENTION:   Once diet advanced: -Ensure Enlive po BID, each supplement provides 350 kcal and 20 grams of protein. -Multivitamin with minerals daily  -Placed "High Calorie, High Protein" handout in AVS  NUTRITION DIAGNOSIS:   Increased nutrient needs related to post-op healing, hip fracture as evidenced by estimated needs.  GOAL:   Patient will meet greater than or equal to 90% of their needs  MONITOR:   PO intake, Supplement acceptance, Labs, Weight trends, I & O's, Diet advancement  REASON FOR ASSESSMENT:   Consult Hip fracture protocol  ASSESSMENT:   82 year old female with past medical history significant for hypothyroidism, hyperlipidemia, migraines, who presented to [facility] after a mechanical fall. Admitted for Left femur fracture.  Patient in room, visitor at bedside. Pt reports eating with no issues at home. She tries to eat a balanced healthy diet. For breakfast she would have yogurt with fruit and granola, for lunch she sometimes has salad and for dinner she may have seafood or chicken.  States the majority of her weight loss happened 16 months ago after her husband passed from parkinson's. No recent weight loss per review of weight records. We discussed diet to help with recovery from hip surgery. Still NPO today awaiting plan for surgery.  Recommend protein shakes after surgery. Will place information on increasing kcals and protein in diet in AVS.  Medications reviewed.  Labs reviewed.  NUTRITION - FOCUSED PHYSICAL EXAM:  Flowsheet Row Most Recent Value  Orbital Region Mild depletion  Upper Arm Region Moderate depletion  Thoracic and Lumbar Region Mild depletion  Buccal Region Mild depletion  Temple Region Mild depletion  Clavicle Bone Region Mild depletion  Clavicle and Acromion Bone Region Mild depletion  Scapular Bone Region Mild depletion  Dorsal Hand Mild depletion  Patellar Region Unable to assess  Anterior  Thigh Region Unable to assess  Posterior Calf Region Unable to assess  Edema (RD Assessment) None  Hair Reviewed  Eyes Reviewed  Mouth Reviewed  Skin Reviewed  Nails Reviewed       Diet Order:   Diet Order             Diet NPO time specified  Diet effective now                   EDUCATION NEEDS:   No education needs have been identified at this time  Skin:  Skin Assessment: Reviewed RN Assessment  Last BM:  4/18  Height:   Ht Readings from Last 1 Encounters:  05/07/22  (1.626 m)    Weight:   Wt Readings from Last 1 Encounters:  05/07/22 53.1 kg    BMI:  Body mass index is 20.09 kg/m.  Estimated Nutritional Needs:   Kcal:  1450-1650  Protein:  75-90g  Fluid:  1.7L/day   Tilda Franco, MS, RD, LDN Inpatient Clinical Dietitian Contact information available via Amion

## 2022-05-09 DIAGNOSIS — S7292XA Unspecified fracture of left femur, initial encounter for closed fracture: Secondary | ICD-10-CM | POA: Diagnosis not present

## 2022-05-09 LAB — CBC
HCT: 35.1 % — ABNORMAL LOW (ref 36.0–46.0)
Hemoglobin: 11.1 g/dL — ABNORMAL LOW (ref 12.0–15.0)
MCH: 29.4 pg (ref 26.0–34.0)
MCHC: 31.6 g/dL (ref 30.0–36.0)
MCV: 92.9 fL (ref 80.0–100.0)
Platelets: 154 10*3/uL (ref 150–400)
RBC: 3.78 MIL/uL — ABNORMAL LOW (ref 3.87–5.11)
RDW: 13.2 % (ref 11.5–15.5)
WBC: 10.5 10*3/uL (ref 4.0–10.5)
nRBC: 0 % (ref 0.0–0.2)

## 2022-05-09 LAB — COMPREHENSIVE METABOLIC PANEL
ALT: 18 U/L (ref 0–44)
AST: 24 U/L (ref 15–41)
Albumin: 3 g/dL — ABNORMAL LOW (ref 3.5–5.0)
Alkaline Phosphatase: 32 U/L — ABNORMAL LOW (ref 38–126)
Anion gap: 10 (ref 5–15)
BUN: 18 mg/dL (ref 8–23)
CO2: 20 mmol/L — ABNORMAL LOW (ref 22–32)
Calcium: 8.1 mg/dL — ABNORMAL LOW (ref 8.9–10.3)
Chloride: 105 mmol/L (ref 98–111)
Creatinine, Ser: 0.7 mg/dL (ref 0.44–1.00)
GFR, Estimated: >60 mL/min (ref >60)
Glucose, Bld: 141 mg/dL — ABNORMAL HIGH (ref 70–99)
Potassium: 4.4 mmol/L (ref 3.5–5.1)
Sodium: 135 mmol/L (ref 135–145)
Total Bilirubin: 1 mg/dL (ref 0.3–1.2)
Total Protein: 5.8 g/dL — ABNORMAL LOW (ref 6.5–8.1)

## 2022-05-09 LAB — HEMOGLOBIN AND HEMATOCRIT, BLOOD
HCT: 33.8 % — ABNORMAL LOW (ref 36.0–46.0)
Hemoglobin: 11.1 g/dL — ABNORMAL LOW (ref 12.0–15.0)

## 2022-05-09 MED ORDER — POLYETHYLENE GLYCOL 3350 17 G PO PACK
17.0000 g | PACK | Freq: Every day | ORAL | Status: DC
  Administered 2022-05-09 – 2022-05-11 (×3): 17 g via ORAL
  Filled 2022-05-09 (×4): qty 1

## 2022-05-09 MED ORDER — LACTATED RINGERS IV BOLUS
500.0000 mL | Freq: Once | INTRAVENOUS | Status: AC
  Administered 2022-05-09: 500 mL via INTRAVENOUS

## 2022-05-09 NOTE — Evaluation (Signed)
Occupational Therapy Evaluation Patient Details Name: Cheryl Bullock MRN: [id number] DOB: 1941-01-10 Today's Date: 05/09/2022   History of Present Illness Pt is an 82 year old female admitted 05/07/22 with Left hip subcapital femoral neck fracture and s/p Left direct anterior THA on 05/08/22.   Clinical Impression   Pt was independent and active prior to admission. She lives alone and drives. Pt presents with post operative pain and impaired standing balance. She requires set up to max assist for ADLs and min guard assist for ambulation with RW. Instructed in use of gait belt to assist L LE in and out of bed. Educated in use of 3 in 1 over toilet. Pt will need instruction in compensatory strategies for LB ADLs. Recommending HHOT. Pt plans to have her friend stay with her when she returns home for a few days.      Recommendations for follow up therapy are one component of a multi-disciplinary discharge planning process, led by the attending physician.  Recommendations may be updated based on patient status, additional functional criteria and insurance authorization.   Assistance Recommended at Discharge Frequent or constant Supervision/Assistance  Patient can return home with the following A little help with walking and/or transfers;A lot of help with bathing/dressing/bathroom;Assistance with cooking/housework;Assist for transportation;Help with stairs or ramp for entrance    Functional Status Assessment  Patient has had a recent decline in their functional status and demonstrates the ability to make significant improvements in function in a reasonable and predictable amount of time.  Equipment Recommendations  BSC/3in1    Recommendations for Other Services       Precautions / Restrictions Precautions Precautions: Fall Restrictions Weight Bearing Restrictions: No LLE Weight Bearing: Weight bearing as tolerated      Mobility Bed Mobility Overal bed mobility: Needs Assistance Bed  Mobility: Supine to Sit, Sit to Supine     Supine to sit: Min assist Sit to supine: Min assist   General bed mobility comments: used gait belt as leg lifter    Transfers Overall transfer level: Needs assistance Equipment used: Rolling walker (2 wheels) Transfers: Sit to/from Stand Sit to Stand: Min guard           General transfer comment: cues for technique      Balance                                           ADL either performed or assessed with clinical judgement   ADL Overall ADL's : Needs assistance/impaired Eating/Feeding: Independent;Sitting   Grooming: Wash/dry hands;Min guard;Standing   Upper Body Bathing: Set up;Sitting   Lower Body Bathing: Maximal assistance;Sit to/from stand   Upper Body Dressing : Set up;Sitting   Lower Body Dressing: Maximal assistance;Sit to/from stand   Toilet Transfer: Min guard;Ambulation;BSC/3in1;Rolling walker (2 wheels)   Toileting- Clothing Manipulation and Hygiene: Set up;Sitting/lateral lean       Functional mobility during ADLs: Min guard;Rolling walker (2 wheels)       Vision Baseline Vision/History: 0 No visual deficits Ability to See in Adequate Light: 0 Adequate Patient Visual Report: No change from baseline       Perception     Praxis      Pertinent Vitals/Pain Pain Assessment Pain Assessment: Faces Faces Pain Scale: Hurts little more Pain Location: left hip Pain Descriptors / Indicators: Sore, Aching Pain Intervention(s): Repositioned, Monitored during session, Ice applied  Hand Dominance Right   Extremity/Trunk Assessment Upper Extremity Assessment Upper Extremity Assessment: Overall WFL for tasks assessed   Lower Extremity Assessment Lower Extremity Assessment: Defer to PT evaluation LLE Deficits / Details: anticipated post op hip weakness, able to perform ankle pumps   Cervical / Trunk Assessment Cervical / Trunk Assessment: Normal   Communication  Communication Communication: HOH   Cognition Arousal/Alertness: Awake/alert Behavior During Therapy: WFL for tasks assessed/performed Overall Cognitive Status: Within Functional Limits for tasks assessed                                       General Comments       Exercises     Shoulder Instructions      Home Living Family/patient expects to be discharged to:: Private residence Living Arrangements: Alone Available Help at Discharge: Friend(s);Available 24 hours/day Type of Home: House Home Access: Stairs to enter [employer] of Steps: 5 Entrance Stairs-Rails: Right;Left;Can reach both Home Layout: Able to live on main level with bedroom/bathroom     Bathroom Shower/Tub: Producer, television/film/video: Standard     Home Equipment: Shower seat;Grab bars - tub/shower   Additional Comments: unsure if she still has BSC or if she can borrow one from a church lending closet      Prior Functioning/Environment Prior Level of Function : Independent/Modified Independent;Driving                        OT Problem List: Impaired balance (sitting and/or standing);Pain;Decreased knowledge of use of DME or AE      OT Treatment/Interventions: Self-care/ADL training;DME and/or AE instruction;Therapeutic activities;Patient/family education;Balance training    OT Goals(Current goals can be found in the care plan section) Acute Rehab OT Goals OT Goal Formulation: With patient Time For Goal Achievement: 05/23/22 Potential to Achieve Goals: Good ADL Goals Pt Will Perform Grooming: standing;with modified independence Pt Will Perform Lower Body Bathing: with modified independence;with adaptive equipment;sit to/from stand Pt Will Perform Lower Body Dressing: with modified independence;with adaptive equipment;sit to/from stand Pt Will Transfer to Toilet: with modified independence;ambulating;bedside commode Pt Will Perform Toileting - Clothing  Manipulation and hygiene: with modified independence;sit to/from stand Additional ADL Goal #1: Pt will perform bed mobility modified independently with gait belt as leg lifter as needed.  OT Frequency: Min 2X/week    Co-evaluation              AM-PAC OT "6 Clicks" Daily Activity     Outcome Measure Help from another person eating meals?: None Help from another person taking care of personal grooming?: A Little Help from another person toileting, which includes using toliet, bedpan, or urinal?: A Little Help from another person bathing (including washing, rinsing, drying)?: A Lot Help from another person to put on and taking off regular upper body clothing?: A Little Help from another person to put on and taking off regular lower body clothing?: A Lot 6 Click Score: 17   End of Session Equipment Utilized During Treatment: Gait belt;Rolling walker (2 wheels)  Activity Tolerance: Patient tolerated treatment well Patient left: in bed;with call bell/phone within reach;with bed alarm set  OT Visit Diagnosis: Unsteadiness on feet (R26.81);Other abnormalities of gait and mobility (R26.89);Pain                Time: 1610-9604 OT Time Calculation (min): 59 min Charges:  OT General Charges $  OT Visit: 1 Visit OT Evaluation $OT Eval Moderate Complexity: 1 Mod OT Treatments $Self Care/Home Management : 38-52 mins  Berna Spare, OTR/L Acute Rehabilitation Services Office: [phone]  Evern Bio 05/09/2022, 4:09 PM

## 2022-05-09 NOTE — Evaluation (Addendum)
Physical Therapy Evaluation Patient Details Name: Cheryl Bullock MRN: [id number] DOB: October 27, 1940 Today's Date: 05/09/2022  History of Present Illness  Pt is an 82 year old female admitted 05/07/22 with Left hip subcapital femoral neck fracture and s/p Left direct anterior THA on 05/08/22.  Clinical Impression  Patient is s/p above surgery resulting in functional limitations due to the deficits listed below (see PT Problem List).  Patient will benefit from acute skilled PT to increase their independence and safety with mobility to facilitate discharge.  Pt assisted with ambulating however limited by dizziness and bleeding at IV site.  Anticipate pt to progress well.  Pt reports she has friends that have offered to stay with her and assist if needed at home.  Pt will need to be able to perform steps (has 5 to enter home) in order to d/c home.   05/09/22 0938  Vital Signs  Pulse Rate 72  Pulse Rate Source Monitor  BP (!) 98/41  BP Location Left Arm  BP Method Automatic  Oxygen Therapy  SpO2 98 %  O2 Device Room Air      Recommendations for follow up therapy are one component of a multi-disciplinary discharge planning process, led by the attending physician.  Recommendations may be updated based on patient status, additional functional criteria and insurance authorization.  Follow Up Recommendations       Assistance Recommended at Discharge PRN  Patient can return home with the following  Assistance with cooking/housework;Assist for transportation;Help with stairs or ramp for entrance    Equipment Recommendations Rolling walker (2 wheels)  Recommendations for Other Services       Functional Status Assessment Patient has had a recent decline in their functional status and demonstrates the ability to make significant improvements in function in a reasonable and predictable amount of time.     Precautions / Restrictions Precautions Precautions: Fall;None Restrictions LLE Weight  Bearing: Weight bearing as tolerated      Mobility  Bed Mobility Overal bed mobility: Needs Assistance Bed Mobility: Supine to Sit     Supine to sit: Min guard, HOB elevated     General bed mobility comments: increased time and effort    Transfers Overall transfer level: Needs assistance Equipment used: Rolling walker (2 wheels) Transfers: Sit to/from Stand Sit to Stand: Min guard, From elevated surface           General transfer comment: verbal cues for positioning    Ambulation/Gait Ambulation/Gait assistance: Min guard, Min assist Gait Distance (Feet): 15 Feet Assistive device: Rolling walker (2 wheels) Gait Pattern/deviations: Step-to pattern, Decreased stance time - left, Antalgic       General Gait Details: verbal cues for sequence, RW positioning, step length; pt became light headed and IV site also bleeding so nurse tech brought recliner to pt, RN over to address IV site; vitals obtained; pt returned to room in recliner  Stairs            Wheelchair Mobility    Modified Rankin (Stroke Patients Only)       Balance                                             Pertinent Vitals/Pain Pain Assessment Pain Assessment: 0-10 Pain Score: 3  Pain Location: left hip Pain Descriptors / Indicators: Sore, Aching Pain Intervention(s): Repositioned, Monitored during session, Premedicated before session  Home Living Family/patient expects to be discharged to:: Private residence Living Arrangements: Alone Available Help at Discharge: Friend(s);Available 24 hours/day Type of Home: House Home Access: Stairs to enter Entrance Stairs-Rails: Right;Left;Can reach both Entrance Stairs-Number of Steps: 5   Home Layout: Able to live on main level with bedroom/bathroom Home Equipment: Toilet riser;Shower seat      Prior Function Prior Level of Function : Independent/Modified Independent                     Hand Dominance         Extremity/Trunk Assessment        Lower Extremity Assessment Lower Extremity Assessment: LLE deficits/detail LLE Deficits / Details: anticipated post op hip weakness, able to perform ankle pumps       Communication   Communication: No difficulties  Cognition Arousal/Alertness: Awake/alert Behavior During Therapy: WFL for tasks assessed/performed Overall Cognitive Status: Within Functional Limits for tasks assessed                                          General Comments      Exercises     Assessment/Plan    PT Assessment Patient needs continued PT services  PT Problem List Decreased strength;Decreased activity tolerance;Decreased mobility;Pain;Decreased knowledge of use of DME       PT Treatment Interventions Stair training;Gait training;DME instruction;Therapeutic exercise;Balance training;Functional mobility training;Therapeutic activities;Patient/family education    PT Goals (Current goals can be found in the Care Plan section)  Acute Rehab PT Goals PT Goal Formulation: With patient Time For Goal Achievement: 05/16/22 Potential to Achieve Goals: Good    Frequency Min 1X/week     Co-evaluation               AM-PAC PT "6 Clicks" Mobility  Outcome Measure Help needed turning from your back to your side while in a flat bed without using bedrails?: A Little Help needed moving from lying on your back to sitting on the side of a flat bed without using bedrails?: A Little Help needed moving to and from a bed to a chair (including a wheelchair)?: A Little Help needed standing up from a chair using your arms (e.g., wheelchair or bedside chair)?: A Little Help needed to walk in hospital room?: A Little Help needed climbing 3-5 steps with a railing? : A Little 6 Click Score: 18    End of Session Equipment Utilized During Treatment: Gait belt Activity Tolerance: Other (comment) (dizziness) Patient left: in chair;with call bell/phone  within reach;with chair alarm set Nurse Communication: Mobility status PT Visit Diagnosis: Difficulty in walking, not elsewhere classified (R26.2)    Time: 4098-1191 PT Time Calculation (min) (ACUTE ONLY): 27 min   Charges:   PT Evaluation $PT Eval Low Complexity: 1 Low PT Treatments $Gait Training: 8-22 mins   Paulino Door, DPT Physical Therapist Acute Rehabilitation Services Office: [phone]   Janan Halter Payson 05/09/2022, 12:21 PM

## 2022-05-09 NOTE — Progress Notes (Signed)
Subjective: 1 Day Post-Op Procedure(s) (LRB): TOTAL HIP ARTHROPLASTY ANTERIOR APPROACH (Left) Patient dizziness this morning when up with PT.   Objective: Vital signs in last 24 hours: Temp:  [97.5 F (36.4 C)-99.4 F (37.4 C)] 97.7 F (36.5 C) (04/20 0529) Pulse Rate:  [55-90] 65 (04/20 0529) Resp:  [7-19] 17 (04/20 0529) BP: (56-148)/(31-94) 110/62 (04/20 0529) SpO2:  [96 %-100 %] 100 % (04/20 0529)  Intake/Output from previous day: 04/19 0701 - 04/20 0700 In: 2334.4 [P.O.:180; I.V.:2104.4; IV Piggyback:50] Out: 2795 [ZOXWR:6045; Blood:300] Intake/Output this shift: No intake/output data recorded.  Recent Labs    05/07/22 2022 05/09/22 0341  HGB 13.6 11.1*   Recent Labs    05/07/22 2022 05/09/22 0341  WBC 15.8* 10.5  RBC 4.59 3.78*  HCT 40.8 35.1*  PLT 190 154   Recent Labs    05/07/22 2022 05/09/22 0341  NA 137 135  K 3.8 4.4  CL 101 105  CO2 24 20*  BUN 25* 18  CREATININE 0.71 0.70  GLUCOSE 101* 141*  CALCIUM 9.9 8.1*   No results for input(s): "LABPT", "INR" in the last 72 hours.  General: dizziness seated in chair in hall Dressing intact    Assessment/Plan: 1 Day Post-Op Procedure(s) (LRB): TOTAL HIP ARTHROPLASTY ANTERIOR APPROACH (Left) Up with therapy Dispo home in next few days      Trisha Ken 05/09/2022, 9:36 AM

## 2022-05-09 NOTE — Progress Notes (Signed)
Physical Therapy Treatment Patient Details Name: CECEILIA CEPHUS MRN: [id number] DOB: May 01, 1940 Today's Date: 05/09/2022   History of Present Illness Pt is an 82 year old female admitted 05/07/22 with Left hip subcapital femoral neck fracture and s/p Left direct anterior THA on 05/08/22.    PT Comments    Pt ambulated in hallway and no complaints of dizziness this afternoon.  Pt returned to recliner and performed LE exercises.     Recommendations for follow up therapy are one component of a multi-disciplinary discharge planning process, led by the attending physician.  Recommendations may be updated based on patient status, additional functional criteria and insurance authorization.  Follow Up Recommendations       Assistance Recommended at Discharge PRN  Patient can return home with the following Assistance with cooking/housework;Assist for transportation;Help with stairs or ramp for entrance   Equipment Recommendations  Rolling walker (2 wheels)    Recommendations for Other Services       Precautions / Restrictions Precautions Precautions: Fall;None Restrictions Weight Bearing Restrictions: No LLE Weight Bearing: Weight bearing as tolerated     Mobility  Bed Mobility Overal bed mobility: Needs Assistance Bed Mobility: Supine to Sit     Supine to sit: Min guard, HOB elevated     General bed mobility comments: pt in recliner    Transfers Overall transfer level: Needs assistance Equipment used: Rolling walker (2 wheels) Transfers: Sit to/from Stand Sit to Stand: Min guard           General transfer comment: verbal cues for positioning    Ambulation/Gait Ambulation/Gait assistance: Min guard Gait Distance (Feet): 50 Feet Assistive device: Rolling walker (2 wheels) Gait Pattern/deviations: Step-to pattern, Decreased stance time - left, Antalgic       General Gait Details: verbal cues for sequence, RW positioning, step length; denies dizziness; distance  to tolerance   Stairs             Wheelchair Mobility    Modified Rankin (Stroke Patients Only)       Balance                                            Cognition Arousal/Alertness: Awake/alert Behavior During Therapy: WFL for tasks assessed/performed Overall Cognitive Status: Within Functional Limits for tasks assessed                                          Exercises General Exercises - Lower Extremity Ankle Circles/Pumps: AROM, Both, 10 reps Quad Sets: AROM, Both, 10 reps Long Arc Quad: AROM, Seated, Left, 10 reps Heel Slides: AAROM, Left, 10 reps Hip ABduction/ADduction: AAROM, Left, 10 reps    General Comments        Pertinent Vitals/Pain Pain Assessment Pain Assessment: 0-10 Pain Score: 5  Pain Location: left hip Pain Descriptors / Indicators: Sore, Aching Pain Intervention(s): Repositioned, Monitored during session    Home Living                          Prior Function            PT Goals (current goals can now be found in the care plan section) Acute Rehab PT Goals PT Goal Formulation: With patient Time For Goal Achievement:  05/16/22 Potential to Achieve Goals: Good Progress towards PT goals: Progressing toward goals    Frequency    Min 1X/week      PT Plan Current plan remains appropriate    Co-evaluation              AM-PAC PT "6 Clicks" Mobility   Outcome Measure  Help needed turning from your back to your side while in a flat bed without using bedrails?: A Little Help needed moving from lying on your back to sitting on the side of a flat bed without using bedrails?: A Little Help needed moving to and from a bed to a chair (including a wheelchair)?: A Little Help needed standing up from a chair using your arms (e.g., wheelchair or bedside chair)?: A Little Help needed to walk in hospital room?: A Little Help needed climbing 3-5 steps with a railing? : A Little 6 Click  Score: 18    End of Session Equipment Utilized During Treatment: Gait belt Activity Tolerance: Patient tolerated treatment well Patient left: in chair;with call bell/phone within reach;with chair alarm set Nurse Communication: Mobility status PT Visit Diagnosis: Difficulty in walking, not elsewhere classified (R26.2)     Time: 1610-9604 PT Time Calculation (min) (ACUTE ONLY): 20 min  Charges:   $Therapeutic Exercise: 8-22 mins                    Paulino Door, DPT Physical Therapist Acute Rehabilitation Services Office: [phone]    Janan Halter P[place] 05/09/2022, 3:24 PM

## 2022-05-09 NOTE — Progress Notes (Signed)
PROGRESS NOTE    Cheryl Bullock  ZOX:[id number] DOB: 07/12/1940 DOA: 05/07/2022 PCP: Gaspar Garbe, MD  Chief Complaint  Patient presents with   Fall    Brief Narrative:   Cheryl Bullock is Cheryl Bullock 82 y.o. female with medical history significant of hip arthritis, hyperlipidemia, endometriosis, fibroid, GERD, temporary global admission, impaired hearing, migraine headaches, osteoporosis, scoliosis, hypothyroidism who was transferred from [facility] emergency department after presenting there with Cheryl Bullock fall injuring her left hip with immediate pain and inability to bear weight on her LLE.  No fever, chills or night sweats. No sore throat, rhinorrhea, dyspnea, wheezing or hemoptysis.  No chest pain, palpitations, diaphoresis, PND, orthopnea or pitting edema of the lower extremities.  No appetite changes, abdominal pain, diarrhea, constipation, melena or hematochezia.  No flank pain, dysuria, frequency or hematuria.  No polyuria, polydipsia, polyphagia or blurred vision.   ED course: Initial vital signs were temperature 98.1 F, pulse 73, respiration 18, BP 113/69 mmHg O2 sat 100% on room air.  Patient received 1 tablet of Percocet 5/325 mg p.o. and morphine 2 mg IVP x 2.   Lab work: CBC showed Cheryl Bullock white count of 15.8, hemoglobin 13.6 g/dL platelets 811.  BMP with Cheryl Bullock glucose of 101 and BUN of 25 mg/deciliter.  Creatinine and electrolytes were normal.   Imaging: Pelvics x-ray and left femur x-ray showed cortical offset of the left femoral neck suspicious for nondisplaced fracture.  Left hip CT scan without contrast showing acute transverse subcapital fracture of the proximal left femur with impaction and slight anterior translation of the main distal fragment.  No fracture of the acetabulum, of the pelvic ring or visualized portions of the left hemisacrum.  There is osteopenia and degenerative change.  Moderate subcutaneous stranding in the lateral left hip area at the level of the greater trochanter.  No  space-occupying hematoma.  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 ASA 81 mg BID Appreciate ortho rec Pain regimen, bowel regimen PT/OT   Post Op Hypotension  Orthostasis Resolved, unclear exactly what caused this C/o dizziness this AM Follow repeat H/H Bolus 500 cc LR  GERD PPI  HLD Simvastatin  Hypothyroidism Synthroid 75 mg daily  Migraines Maxalt prn    DVT prophylaxis: ASA BID Code Status: full Family Communication: none Disposition:   Status is: Inpatient Remains inpatient appropriate because: need for post op therapy, disposition   Consultants:  ortho  Procedures:  S/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  Antimicrobials:  Anti-infectives (From admission, onward)    Start     Dose/Rate Route Frequency Ordered Stop   05/09/22 0130  ceFAZolin (ANCEF) IVPB 1 g/50 mL premix        1 g 100 mL/hr over 30 Minutes Intravenous Every 6 hours 05/08/22 2214 05/09/22 0719   05/08/22 1830  ceFAZolin (ANCEF) IVPB 2g/100 mL premix        2 g 200 mL/hr over 30 Minutes Intravenous  Once 05/08/22 1827 05/08/22 1925   05/08/22 1828  ceFAZolin (ANCEF) 2-4 GM/100ML-% IVPB       Note to Pharmacy: Cheryl Bullock W: cabinet override      05/08/22 1828 05/08/22 1925  Subjective: C/o lightheadedness with standing earlier   Objective: Vitals:   05/09/22 0013 05/09/22 0154 05/09/22 0529 05/09/22 0938  BP: (!) 100/58 104/62 110/62 (!) 98/41  Pulse: 61 60 65 72  Resp: Temp: (!) 97.5 F (36.4 C) (!) 97.5 F (36.4 C) 97.7 F (36.5 C)   TempSrc: Oral Oral Oral   SpO2: 100% 100% 100% 98%  Weight:      Height:        Intake/Output Summary  (Last 24 hours) at 05/09/2022 1253 Last data filed at 05/09/2022 2956 Gross per 24 hour  Intake 2334.37 ml  Output 2795 ml  Net -460.63 ml   Filed Weights   05/07/22 1904  Weight: 53.1 kg    Examination:  General exam: Appears calm and comfortable  Respiratory system: unlabored Cardiovascular system:RRR Gastrointestinal system: Abdomen is nondistended, soft and nontender Central nervous system: Alert and oriented. No focal neurological deficits. Extremities: dressing to LLE    Data Reviewed: I have personally reviewed following labs and imaging studies  CBC: Recent Labs  Lab 05/07/22 2022 05/09/22 0341 05/09/22 1211  WBC 15.8* 10.5  --   NEUTROABS 14.0*  --   --   HGB 13.6 11.1* 11.1*  HCT 40.8 35.1* 33.8*  MCV 88.9 92.9  --   PLT 190 154  --     Basic Metabolic Panel: Recent Labs  Lab 05/07/22 2022 05/09/22 0341  NA 137 135  K 3.8 4.4  CL 101 105  CO2 24 20*  GLUCOSE 101* 141*  BUN 25* 18  CREATININE 0.71 0.70  CALCIUM 9.9 8.1*    GFR: Estimated Creatinine Clearance: 45.4 mL/min (by C-G formula based on SCr of 0.7 mg/dL).  Liver Function Tests: Recent Labs  Lab 05/09/22 0341  AST 24  ALT 18  ALKPHOS 32*  BILITOT 1.0  PROT 5.8*  ALBUMIN 3.0*    CBG: No results for input(s): "GLUCAP" in the last 168 hours.   Recent Results (from the past 240 hour(s))  Surgical pcr screen     Status: None   Collection Time: 05/08/22  8:56 AM   Specimen: Nasal Mucosa; Nasal Swab  Result Value Ref Range Status   MRSA, PCR NEGATIVE NEGATIVE Final   Staphylococcus aureus NEGATIVE NEGATIVE Final    Comment: (NOTE) The Xpert SA Assay (FDA approved for NASAL specimens in patients 17 years of age and older), is one component of Cheryl Bullock comprehensive surveillance program. It is not intended to diagnose infection nor to guide or monitor treatment. Performed at [facility], [address], [place], [place] [address]          Radiology  Studies: DG Pelvis Portable  Result Date: 05/08/2022 CLINICAL DATA:  Postoperative. EXAM: PORTABLE PELVIS 1-2 VIEWS COMPARISON:  Radiograph dated 05/07/2022. FINDINGS: There is Cheryl Bullock total left hip arthroplasty. The arthroplasty components appear intact and in anatomic alignment. There is no acute fracture or dislocation. The bones are osteopenic. There is moderate arthritic changes of the right hip. Cutaneous staples noted over the left hip. IMPRESSION: Total left hip arthroplasty. No complicating features. Electronically Signed   By: Elgie Collard M.D.   On: 05/08/2022 22:42   DG HIP UNILAT WITH PELVIS 1V LEFT  Result Date: 05/08/2022 CLINICAL DATA:  Left hip surgery EXAM: DG HIP (WITH OR WITHOUT PELVIS) 1V*L* FLUOROSCOPY TIME:  Fluoroscopy Time:  20 seconds Radiation Exposure Index (if provided by the fluoroscopic device): 2 mGy Number of Acquired Spot Images: 5 COMPARISON:  Radiographs and CT 05/07/2022 FINDINGS: Multiple intraoperative fluoroscopic spot images are provided without Tonny Isensee radiologist present during left THA. IMPRESSION: Left THA.  See op report for details. Electronically Signed   By: Minerva Fester M.D.   On: 05/08/2022 21:06   DG C-Arm 1-60 Min-No Report  Result Date: 05/08/2022 Fluoroscopy was utilized by the requesting physician.  No radiographic interpretation.   DG C-Arm 1-60 Min-No Report  Result Date: 05/08/2022 Fluoroscopy was utilized by the requesting physician.  No radiographic interpretation.   Chest Portable 1 View  Result Date: 05/08/2022 CLINICAL DATA:  Preop for hip fracture. EXAM: PORTABLE CHEST 1 VIEW COMPARISON:  None Available. FINDINGS: The heart size and mediastinal contours are within normal limits. Both lungs are clear. The visualized skeletal structures are unremarkable. IMPRESSION: No active disease. Electronically Signed   By: Lupita Raider M.D.   On: 05/08/2022 10:36   CT Hip Left Wo Contrast  Result Date: 05/07/2022 CLINICAL DATA:  Proximal left  femoral fracture. EXAM: CT OF THE LEFT HIP WITHOUT CONTRAST TECHNIQUE: Multidetector CT imaging of the left hip was performed according to the standard protocol. Multiplanar CT image reconstructions were also generated. RADIATION DOSE REDUCTION: This exam was performed according to the departmental dose-optimization program which includes automated exposure control, adjustment of the mA and/or kV according to patient size and/or use of iterative reconstruction technique. COMPARISON:  Left hip and femur films earlier today. FINDINGS: Bones/Joint/Cartilage Osteopenia. There is an acute transverse subcapital fracture of the proximal left femur with impaction and with slight anterior translation of the main distal fragment. There are Gregrey Bloyd few tiny comminution fragments along the posterior fracture margin. No fracture is seen of the acetabulum, of the left pelvic ring or the visualized portion of the left hemisacrum. There is joint space loss at the left hip with acetabular and femoral head osteophytes and labral chondrocalcinosis. There are degenerative subcortical cystic changes along the acetabulum greatest superiorly. The joint space loss is greatest superiorly where it is bone-on-bone. There is spurring and sclerosis with subcortical cystic changes at the symphysis pubis and calcified pannus in the joint space. Mild spurring of the visualized left SI joint. Ligaments Suboptimally assessed by CT. Muscles and Tendons No intramuscular hematoma. Area tendons are not well seen with this technique but unremarkable as far as visualized. Soft tissues There is moderate stranding subcutaneously in the lateral left hip area at the level of the greater trochanter. No space-occupying hematoma. Visualized soft tissue structures within the left hemipelvis show no acute findings. IMPRESSION: 1. Acute transverse subcapital fracture of the proximal left femur with impaction and with slight anterior translation of the main distal  fragment. 2. No fracture of the acetabulum, of the left pelvic ring or the visualized portion of the left hemisacrum. 3. Osteopenia and degenerative change. 4. Moderate subcutaneous stranding in the lateral left hip area at the level of the greater trochanter. No space-occupying hematoma. Electronically Signed   By: Almira Bar M.D.   On: 05/07/2022 22:14   DG Pelvis 1-2 Views  Result Date: 05/07/2022 CLINICAL DATA:  Left hip pain, fall today. EXAM: PELVIS - 1-2 VIEW COMPARISON:  None Available. FINDINGS: Cortical offset of the left femoral neck suspicious for nondisplaced fracture. No additional fracture of the pelvis. 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 No symphyseal or sacroiliac diastasis. No pubic rami fracture. IMPRESSION: Cortical offset of the left femoral neck suspicious for nondisplaced fracture. Electronically Signed   By: Shawna Orleans  Sanford M.D.   On: 05/07/2022 20:02   DG FEMUR MIN 2 VIEWS LEFT  Result Date: 05/07/2022 CLINICAL DATA:  Left hip pain. Fall. EXAM: LEFT FEMUR 2 VIEWS COMPARISON:  None Available. FINDINGS: Cortical offset of the femoral neck suspicious for nondisplaced fracture. The distal femur is intact. Knee alignment is maintained. No evident fracture the pubic rami. IMPRESSION: Cortical offset of the left femoral neck suspicious for nondisplaced fracture. Electronically Signed   By: Narda Rutherford M.D.   On: 05/07/2022 20:01        Scheduled Meds:  aspirin  81 mg Oral BID   calcium carbonate  1 tablet Oral Q breakfast   cholecalciferol  2,000 Units Oral Daily   docusate sodium  100 mg Oral BID   levothyroxine  75 mcg Oral Daily   pantoprazole  40 mg Oral Daily   simvastatin  5 mg Oral Daily   Continuous Infusions:  sodium chloride     lactated ringers     methocarbamol (ROBAXIN) IV       LOS: 1 day    Time spent: over 30 min    Lacretia Nicks, MD [facility]   To contact  the attending provider between 7A-7P or the covering provider during after hours 7P-7A, please log into the web site [email or web address] and access using universal [place] password for that web site. If you do not have the password, please call the hospital operator.  05/09/2022, 12:53 PM

## 2022-05-09 NOTE — Plan of Care (Signed)
Plan of care reviewed and discussed. °

## 2022-05-10 DIAGNOSIS — S7292XA Unspecified fracture of left femur, initial encounter for closed fracture: Secondary | ICD-10-CM | POA: Diagnosis not present

## 2022-05-10 LAB — CBC WITH DIFFERENTIAL/PLATELET
Abs Immature Granulocytes: 0.03 10*3/uL (ref 0.00–0.07)
Basophils Absolute: 0 10*3/uL (ref 0.0–0.1)
Basophils Relative: 0 %
Eosinophils Absolute: 0.5 10*3/uL (ref 0.0–0.5)
Eosinophils Relative: 5 %
HCT: 26.2 % — ABNORMAL LOW (ref 36.0–46.0)
Hemoglobin: 8.8 g/dL — ABNORMAL LOW (ref 12.0–15.0)
Immature Granulocytes: 0 %
Lymphocytes Relative: 15 %
Lymphs Abs: 1.3 10*3/uL (ref 0.7–4.0)
MCH: 30.1 pg (ref 26.0–34.0)
MCHC: 33.6 g/dL (ref 30.0–36.0)
MCV: 89.7 fL (ref 80.0–100.0)
Monocytes Absolute: 0.7 10*3/uL (ref 0.1–1.0)
Monocytes Relative: 8 %
Neutro Abs: 6.4 10*3/uL (ref 1.7–7.7)
Neutrophils Relative %: 72 %
Platelets: 133 10*3/uL — ABNORMAL LOW (ref 150–400)
RBC: 2.92 MIL/uL — ABNORMAL LOW (ref 3.87–5.11)
RDW: 13.4 % (ref 11.5–15.5)
WBC: 8.9 10*3/uL (ref 4.0–10.5)
nRBC: 0 % (ref 0.0–0.2)

## 2022-05-10 LAB — COMPREHENSIVE METABOLIC PANEL
ALT: 16 U/L (ref 0–44)
AST: 28 U/L (ref 15–41)
Albumin: 2.5 g/dL — ABNORMAL LOW (ref 3.5–5.0)
Alkaline Phosphatase: 28 U/L — ABNORMAL LOW (ref 38–126)
Anion gap: 4 — ABNORMAL LOW (ref 5–15)
BUN: 23 mg/dL (ref 8–23)
CO2: 25 mmol/L (ref 22–32)
Calcium: 7.9 mg/dL — ABNORMAL LOW (ref 8.9–10.3)
Chloride: 105 mmol/L (ref 98–111)
Creatinine, Ser: 0.79 mg/dL (ref 0.44–1.00)
GFR, Estimated: >60 mL/min (ref >60)
Glucose, Bld: 116 mg/dL — ABNORMAL HIGH (ref 70–99)
Potassium: 4 mmol/L (ref 3.5–5.1)
Sodium: 134 mmol/L — ABNORMAL LOW (ref 135–145)
Total Bilirubin: 0.5 mg/dL (ref 0.3–1.2)
Total Protein: 4.8 g/dL — ABNORMAL LOW (ref 6.5–8.1)

## 2022-05-10 LAB — PHOSPHORUS: Phosphorus: 1.9 mg/dL — ABNORMAL LOW (ref 2.5–4.6)

## 2022-05-10 LAB — MAGNESIUM: Magnesium: 2.3 mg/dL (ref 1.7–2.4)

## 2022-05-10 MED ORDER — ACETAMINOPHEN 325 MG PO TABS: 650.0000 mg | ORAL_TABLET | Freq: Four times a day (QID) | ORAL | Status: DC | PRN

## 2022-05-10 MED ORDER — FERROUS GLUCONATE 324 (38 FE) MG PO TABS
324.0000 mg | ORAL_TABLET | ORAL | Status: DC
  Administered 2022-05-10: 324 mg via ORAL
  Filled 2022-05-10: qty 1

## 2022-05-10 MED ORDER — ACETAMINOPHEN 500 MG PO TABS
1000.0000 mg | ORAL_TABLET | Freq: Three times a day (TID) | ORAL | Status: DC
  Administered 2022-05-10 – 2022-05-11 (×3): 1000 mg via ORAL
  Filled 2022-05-10 (×3): qty 2

## 2022-05-10 MED ORDER — K PHOS MONO-SOD PHOS DI & MONO 155-852-130 MG PO TABS
500.0000 mg | ORAL_TABLET | Freq: Two times a day (BID) | ORAL | Status: DC
  Administered 2022-05-10 – 2022-05-11 (×2): 500 mg via ORAL
  Filled 2022-05-10 (×3): qty 2

## 2022-05-10 MED ORDER — SODIUM CHLORIDE 0.9 % IV BOLUS
250.0000 mL | Freq: Once | INTRAVENOUS | Status: AC
  Administered 2022-05-10: 250 mL via INTRAVENOUS

## 2022-05-10 NOTE — Plan of Care (Signed)

## 2022-05-10 NOTE — Progress Notes (Signed)
Occupational Therapy Treatment Patient Details Name: Cheryl Bullock MRN: [id number] DOB: 13-Feb-1940 Today's Date: 05/10/2022   History of present illness Pt is an 82 year old female admitted 05/07/22 with Left hip subcapital femoral neck fracture and s/p Left direct anterior THA on 05/08/22.   OT comments  Pt with good participation.  Pt will obtain a reacher.  Pt has a sock aid.     Recommendations for follow up therapy are one component of a multi-disciplinary discharge planning process, led by the attending physician.  Recommendations may be updated based on patient status, additional functional criteria and insurance authorization.    Assistance Recommended at Discharge Frequent or constant Supervision/Assistance  Patient can return home with the following  A little help with walking and/or transfers;A lot of help with bathing/dressing/bathroom;Assistance with cooking/housework;Assist for transportation;Help with stairs or ramp for entrance   Equipment Recommendations  BSC/3in1    Recommendations for Other Services      Precautions / Restrictions Precautions Precautions: Fall Restrictions LLE Weight Bearing: Weight bearing as tolerated       Mobility Bed Mobility Overal bed mobility: Needs Assistance Bed Mobility: Sit to Supine     Supine to sit: Min guard Sit to supine: Min assist        Transfers Overall transfer level: Needs assistance Equipment used: Rolling walker (2 wheels) Transfers: Sit to/from Stand Sit to Stand: Min guard           General transfer comment: cues for technique         ADL either performed or assessed with clinical judgement   ADL                       Lower Body Dressing: Sit to/from stand;Moderate assistance;Cueing for sequencing;Cueing for safety;With adaptive equipment          Education provided regarding use of AE for LB dressing            Extremity/Trunk Assessment Upper Extremity Assessment Upper  Extremity Assessment: Generalized weakness            Vision Patient Visual Report: No change from baseline            Cognition Arousal/Alertness: Awake/alert Behavior During Therapy: WFL for tasks assessed/performed Overall Cognitive Status: Within Functional Limits for tasks assessed                                                     Pertinent Vitals/ Pain       Pain Assessment Pain Score: 4  Pain Location: left hip Pain Descriptors / Indicators: Sore Pain Intervention(s): Limited activity within patient's tolerance         Frequency  Min 2X/week        Progress Toward Goals  OT Goals(current goals can now be found in the care plan section)  Progress towards OT goals: Progressing toward goals  Acute Rehab OT Goals OT Goal Formulation: With patient Time For Goal Achievement: 05/23/22 Potential to Achieve Goals: Good  Plan Discharge plan remains appropriate       AM-PAC OT "6 Clicks" Daily Activity     Outcome Measure   Help from another person eating meals?: None Help from another person taking care of personal grooming?: A Little Help from another person toileting, which includes using toliet, bedpan, or  urinal?: A Little Help from another person bathing (including washing, rinsing, drying)?: A Lot Help from another person to put on and taking off regular upper body clothing?: A Little Help from another person to put on and taking off regular lower body clothing?: A Lot 6 Click Score: 17    End of Session Equipment Utilized During Treatment: Gait belt;Rolling walker (2 wheels)  OT Visit Diagnosis: Unsteadiness on feet (R26.81);Other abnormalities of gait and mobility (R26.89);Pain   Activity Tolerance Patient tolerated treatment well   Patient Left in bed;with call bell/phone within reach   Nurse Communication Mobility status        Time: 1610-9604 OT Time Calculation (min): 18 min  Charges: OT General Charges $OT  Visit: 1 Visit OT Treatments $Self Care/Home Management : 8-22 mins  Lise Auer, OT Acute Rehabilitation Services  [phone]     Einar Crow D 05/10/2022, 5:07 PM

## 2022-05-10 NOTE — Progress Notes (Signed)
Subjective: 2 Days Post-Op Procedure(s) (LRB): TOTAL HIP ARTHROPLASTY ANTERIOR APPROACH (Left) Patient reports pain as moderate.  Her blood pressure is slightly soft.  She does have acute blood loss anemia from her fracture and surgery with a hemoglobin today of 8.8.  She does live alone.  She says she does have a lot of friends and family support but understands that therapy here will have good insight onto what the appropriate post hospital disposition should be in terms of home with HHPT versus short-term skilled nursing.  Objective: Vital signs in last 24 hours: Temp:  [97.9 F (36.6 C)-99.5 F (37.5 C)] 99.5 F (37.5 C) (04/21 0447) Pulse Rate:  [69-82] 69 (04/21 0447) Resp:  [17-18] 17 (04/21 0447) BP: (97-106)/(45-63) 97/45 (04/21 0447) SpO2:  [97 %-100 %] 97 % (04/21 0447)  Intake/Output from previous day: 04/20 0701 - 04/21 0700 In: 360 [P.O.:360] Out: 500 [Urine:500] Intake/Output this shift: Total I/O In: 120 [P.O.:120] Out: -   Recent Labs    05/07/22 2022 05/09/22 0341 05/09/22 1211 05/10/22 0313  HGB 13.6 11.1* 11.1* 8.8*   Recent Labs    05/09/22 0341 05/09/22 1211 05/10/22 0313  WBC 10.5  --  8.9  RBC 3.78*  --  2.92*  HCT 35.1* 33.8* 26.2*  PLT 154  --  133*   Recent Labs    05/09/22 0341 05/10/22 0313  NA 135 134*  K 4.4 4.0  CL 105 105  CO2 20* 25  BUN 18 23  CREATININE 0.70 0.79  GLUCOSE 141* 116*  CALCIUM 8.1* 7.9*   No results for input(s): "LABPT", "INR" in the last 72 hours.  Sensation intact distally Intact pulses distally Dorsiflexion/Plantar flexion intact Incision: scant drainage   Assessment/Plan: 2 Days Post-Op Procedure(s) (LRB): TOTAL HIP ARTHROPLASTY ANTERIOR APPROACH (Left) Up with therapy      Kathryne Hitch 05/10/2022, 12:02 PM

## 2022-05-10 NOTE — Progress Notes (Signed)
PROGRESS NOTE    Cheryl Bullock  ZOX:[id number] DOB: 1940-08-16 DOA: 05/07/2022 PCP: Gaspar Garbe, MD  Chief Complaint  Patient presents with   Fall    Brief Narrative:   Cheryl Bullock is Cheryl Bullock 82 y.o. female with medical history significant of hip arthritis, hyperlipidemia, endometriosis, fibroid, GERD, temporary global admission, impaired hearing, migraine headaches, osteoporosis, scoliosis, hypothyroidism who was transferred from [facility] emergency department after presenting there with Cheryl Bullock fall injuring her left hip with immediate pain and inability to bear weight on her LLE.  No fever, chills or night sweats. No sore throat, rhinorrhea, dyspnea, wheezing or hemoptysis.  No chest pain, palpitations, diaphoresis, PND, orthopnea or pitting edema of the lower extremities.  No appetite changes, abdominal pain, diarrhea, constipation, melena or hematochezia.  No flank pain, dysuria, frequency or hematuria.  No polyuria, polydipsia, polyphagia or blurred vision.   ED course: Initial vital signs were temperature 98.1 F, pulse 73, respiration 18, BP 113/69 mmHg O2 sat 100% on room air.  Patient received 1 tablet of Percocet 5/325 mg p.o. and morphine 2 mg IVP x 2.   Lab work: CBC showed Cheryl Bullock white count of 15.8, hemoglobin 13.6 g/dL platelets 811.  BMP with Cheryl Bullock glucose of 101 and BUN of 25 mg/deciliter.  Creatinine and electrolytes were normal.   Imaging: Pelvics x-ray and left femur x-ray showed cortical offset of the left femoral neck suspicious for nondisplaced fracture.  Left hip CT scan without contrast showing acute transverse subcapital fracture of the proximal left femur with impaction and slight anterior translation of the main distal fragment.  No fracture of the acetabulum, of the pelvic ring or visualized portions of the left hemisacrum.  There is osteopenia and degenerative change.  Moderate subcutaneous stranding in the lateral left hip area at the level of the greater trochanter.  No  space-occupying hematoma.  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 ASA 81 mg BID Appreciate ortho rec Pain regimen, bowel regimen PT/OT   Post Op Blood Loss Anemia Trend Start iron   Post Op Hypotension  Orthostasis Resolved, unclear exactly what caused this Seems improved  GERD PPI  HLD Simvastatin  Hypothyroidism Synthroid 75 mg daily  Migraines Maxalt prn    DVT prophylaxis: ASA BID Code Status: full Family Communication: none Disposition:   Status is: Inpatient Remains inpatient appropriate because: need for post op therapy, disposition   Consultants:  ortho  Procedures:  S/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  Antimicrobials:  Anti-infectives (From admission, onward)    Start     Dose/Rate Route Frequency Ordered Stop   05/09/22 0130  ceFAZolin (ANCEF) IVPB 1 g/50 mL premix        1 g 100 mL/hr over 30 Minutes Intravenous Every 6 hours 05/08/22 2214 05/09/22 0719   05/08/22 1830  ceFAZolin (ANCEF) IVPB 2g/100 mL premix        2 g 200 mL/hr over 30 Minutes Intravenous  Once 05/08/22 1827 05/08/22 1925   05/08/22 1828  ceFAZolin (ANCEF) 2-4 GM/100ML-% IVPB       Note to Pharmacy: Cheryl Bullock W: cabinet override      05/08/22 1828 05/08/22 1925  Subjective: C/o pain today  Objective: Vitals:   05/09/22 1810 05/09/22 2113 05/10/22 0447 05/10/22 1330  BP: 105/63 (!) 105/51 (!) 97/45 (!) 102/54  Pulse: 78 82 69 73  Resp: 18 18 17 18   Temp: 98.6 F (37 C) 98.5 F (36.9 C) 99.5 F (37.5 C) 98.1 F (36.7 C)  TempSrc:  Oral Oral Oral  SpO2: 100% 100% 97% 100%  Weight:      Height:        Intake/Output Summary (Last 24  hours) at 05/10/2022 1606 Last data filed at 05/10/2022 1102 Gross per 24 hour  Intake 480 ml  Output 300 ml  Net 180 ml   Filed Weights   05/07/22 1904  Weight: 53.1 kg    Examination:  General: No acute distress. Cardiovascular: RRR Lungs: unlabored Neurological: Alert and oriented 3. Moves all extremities 4 . Cranial nerves II through XII grossly intact. Extremities: dressing to LLE   Data Reviewed: I have personally reviewed following labs and imaging studies  CBC: Recent Labs  Lab 05/07/22 2022 05/09/22 0341 05/09/22 1211 05/10/22 0313  WBC 15.8* 10.5  --  8.9  NEUTROABS 14.0*  --   --  6.4  HGB 13.6 11.1* 11.1* 8.8*  HCT 40.8 35.1* 33.8* 26.2*  MCV 88.9 92.9  --  89.7  PLT 190 154  --  133*    Basic Metabolic Panel: Recent Labs  Lab 05/07/22 2022 05/09/22 0341 05/10/22 0313  NA 137 135 134*  K 3.8 4.4 4.0  CL 101 105 105  CO2 24 20* 25  GLUCOSE 101* 141* 116*  BUN 25* 18 23  CREATININE 0.71 0.70 0.79  CALCIUM 9.9 8.1* 7.9*  MG  --   --  2.3  PHOS  --   --  1.9*    GFR: Estimated Creatinine Clearance: 45.4 mL/min (by C-G formula based on SCr of 0.79 mg/dL).  Liver Function Tests: Recent Labs  Lab 05/09/22 0341 05/10/22 0313  AST 24 28  ALT 18 16  ALKPHOS 32* 28*  BILITOT 1.0 0.5  PROT 5.8* 4.8*  ALBUMIN 3.0* 2.5*    CBG: No results for input(s): "GLUCAP" in the last 168 hours.   Recent Results (from the past 240 hour(s))  Surgical pcr screen     Status: None   Collection Time: 05/08/22  8:56 AM   Specimen: Nasal Mucosa; Nasal Swab  Result Value Ref Range Status   MRSA, PCR NEGATIVE NEGATIVE Final   Staphylococcus aureus NEGATIVE NEGATIVE Final    Comment: (NOTE) The Xpert SA Assay (FDA approved for NASAL specimens in patients 74 years of age and older), is one component of Cheryl Bullock comprehensive surveillance program. It is not intended to diagnose infection nor to guide or monitor treatment. Performed at [facility], [address] [place], [place] [address]          Radiology Studies: DG Pelvis Portable  Result Date: 05/08/2022 CLINICAL DATA:  Postoperative. EXAM: PORTABLE PELVIS 1-2 VIEWS COMPARISON:  Radiograph dated 05/07/2022. FINDINGS: There is Cheryl Bullock total left hip arthroplasty. The arthroplasty components appear intact and in anatomic alignment. There is no acute fracture or dislocation. The bones are osteopenic. There is moderate arthritic changes of the right hip. Cutaneous staples noted over the left hip. IMPRESSION: Total left hip arthroplasty. No complicating features. Electronically Signed   By: Elgie Collard M.D.   On: 05/08/2022 22:42   DG HIP UNILAT WITH PELVIS 1V LEFT  Result Date: 05/08/2022 CLINICAL DATA:  Left hip surgery EXAM: DG HIP (WITH OR WITHOUT PELVIS) 1V*L* FLUOROSCOPY TIME:  Fluoroscopy Time:  20 seconds Radiation Exposure Index (if provided by the fluoroscopic device): 2 mGy Number of Acquired Spot Images: 5 COMPARISON:  Radiographs and CT 05/07/2022 FINDINGS: Multiple intraoperative fluoroscopic spot images are provided without Cheryl Bullock radiologist present during left THA. IMPRESSION: Left THA.  See op report for details. Electronically Signed   By: Minerva Fester M.D.   On: 05/08/2022 21:06   DG C-Arm 1-60 Min-No Report  Result Date: 05/08/2022 Fluoroscopy was utilized by the requesting physician.  No radiographic interpretation.   DG C-Arm 1-60 Min-No Report  Result Date: 05/08/2022 Fluoroscopy was utilized by the requesting physician.  No radiographic interpretation.        Scheduled Meds:  acetaminophen  1,000 mg Oral Q8H   aspirin  81 mg Oral BID   calcium carbonate  1 tablet Oral Q breakfast   cholecalciferol  2,000 Units Oral Daily   docusate sodium  100 mg Oral BID   ferrous gluconate  324 mg Oral QODAY   levothyroxine  75 mcg Oral Daily   pantoprazole  40 mg Oral Daily   polyethylene glycol  17 g Oral Daily   simvastatin  5 mg Oral Daily    Continuous Infusions:  sodium chloride     methocarbamol (ROBAXIN) IV       LOS: 2 days    Time spent: over 30 min    Lacretia Nicks, MD [facility]   To contact the attending provider between 7A-7P or the covering provider during after hours 7P-7A, please log into the web site [email or web address] and access using universal [place] password for that web site. If you do not have the password, please call the hospital operator.  05/10/2022, 4:06 PM

## 2022-05-10 NOTE — Progress Notes (Signed)
Physical Therapy Treatment Patient Details Name: Cheryl Bullock MRN: [id number] DOB: 1941-01-09 Today's Date: 05/10/2022   History of Present Illness Pt is an 82 year old female admitted 05/07/22 with Left hip subcapital femoral neck fracture and s/p Left direct anterior THA on 05/08/22.    PT Comments    Pt ambulated in hallway and practiced stairs technique.  Pt returned to bed and performed exercises a few times each but too fatigued to perform increased repetitions.  HEP handout provided.  Pt does not yet feel ready for d/c today.  Pt will have friend available to assist her at home.  Pt would benefit from another day of therapies prior to d/c home.    Recommendations for follow up therapy are one component of a multi-disciplinary discharge planning process, led by the attending physician.  Recommendations may be updated based on patient status, additional functional criteria and insurance authorization.  Follow Up Recommendations       Assistance Recommended at Discharge PRN  Patient can return home with the following Assistance with cooking/housework;Assist for transportation;Help with stairs or ramp for entrance   Equipment Recommendations  Rolling walker (2 wheels)    Recommendations for Other Services       Precautions / Restrictions Precautions Precautions: Fall Restrictions LLE Weight Bearing: Weight bearing as tolerated     Mobility  Bed Mobility Overal bed mobility: Needs Assistance Bed Mobility: Sit to Supine     Supine to sit: Min guard Sit to supine: Min assist   General bed mobility comments: assist required for Lt LE    Transfers Overall transfer level: Needs assistance Equipment used: Rolling walker (2 wheels) Transfers: Sit to/from Stand Sit to Stand: Min guard           General transfer comment: cues for technique    Ambulation/Gait Ambulation/Gait assistance: Min guard Gait Distance (Feet): 80 Feet Assistive device: Rolling walker (2  wheels) Gait Pattern/deviations: Step-to pattern, Decreased stance time - left, Antalgic Gait velocity: decr     General Gait Details: verbal cues for sequence, RW positioning, step length; denies dizziness; a few short standing rest breaks due to burning incisional pain   Stairs Stairs: Yes Stairs assistance: Min guard Stair Management: Step to pattern, Forwards, Two rails Number of Stairs: 2 General stair comments: verbal cues for sequence and safety; pt performed twice   Wheelchair Mobility    Modified Rankin (Stroke Patients Only)       Balance                                            Cognition Arousal/Alertness: Awake/alert Behavior During Therapy: WFL for tasks assessed/performed Overall Cognitive Status: Within Functional Limits for tasks assessed                                          Exercises General Exercises - Lower Extremity Ankle Circles/Pumps: AROM, Both, 10 reps Quad Sets: AROM, Left, 5 reps Short Arc Quad: AAROM, Left, 5 reps Heel Slides: AAROM, Seated, Supine, 5 reps, Left Hip ABduction/ADduction: AAROM, Left, 5 reps    General Comments        Pertinent Vitals/Pain Pain Assessment Pain Assessment: 0-10 Pain Score: 6  Pain Location: left hip Pain Descriptors / Indicators: Burning Pain Intervention(s): Monitored during  session, Repositioned    Home Living                          Prior Function            PT Goals (current goals can now be found in the care plan section) Progress towards PT goals: Progressing toward goals    Frequency    Min 1X/week      PT Plan Current plan remains appropriate    Co-evaluation              AM-PAC PT "6 Clicks" Mobility   Outcome Measure  Help needed turning from your back to your side while in a flat bed without using bedrails?: A Little Help needed moving from lying on your back to sitting on the side of a flat bed without using  bedrails?: A Little Help needed moving to and from a bed to a chair (including a wheelchair)?: A Little Help needed standing up from a chair using your arms (e.g., wheelchair or bedside chair)?: A Little Help needed to walk in hospital room?: A Little Help needed climbing 3-5 steps with a railing? : A Little 6 Click Score: 18    End of Session Equipment Utilized During Treatment: Gait belt Activity Tolerance: Patient tolerated treatment well Patient left: with call bell/phone within reach;in bed Nurse Communication: Mobility status PT Visit Diagnosis: Difficulty in walking, not elsewhere classified (R26.2)     Time: 1610-9604 PT Time Calculation (min) (ACUTE ONLY): 26 min  Charges:  $Gait Training: 8-22 mins $Therapeutic Exercise: 8-22 mins                     Paulino Door, DPT Physical Therapist Acute Rehabilitation Services Office: [phone]    Cheryl Bullock 05/10/2022, 3:08 PM

## 2022-05-10 NOTE — Progress Notes (Signed)
Physical Therapy Treatment Patient Details Name: Cheryl Bullock MRN: [id number] DOB: October 23, 1940 Today's Date: 05/10/2022   History of Present Illness Pt is an 82 year old female admitted 05/07/22 with Left hip subcapital femoral neck fracture and s/p Left direct anterior THA on 05/08/22.    PT Comments    Pt requiring increased time and reports more incision burning pain today.  Pt able to progress ambulation distance to 80 feet.  Pt will need to be able to perform steps to enter home in order to d/c home so will practice steps this afternoon.    Recommendations for follow up therapy are one component of a multi-disciplinary discharge planning process, led by the attending physician.  Recommendations may be updated based on patient status, additional functional criteria and insurance authorization.  Follow Up Recommendations       Assistance Recommended at Discharge PRN  Patient can return home with the following Assistance with cooking/housework;Assist for transportation;Help with stairs or ramp for entrance   Equipment Recommendations  Rolling walker (2 wheels)    Recommendations for Other Services       Precautions / Restrictions Precautions Precautions: Fall Restrictions LLE Weight Bearing: Weight bearing as tolerated     Mobility  Bed Mobility Overal bed mobility: Needs Assistance Bed Mobility: Supine to Sit     Supine to sit: Min guard     General bed mobility comments: utilized gait belt to self assist Lt LE    Transfers Overall transfer level: Needs assistance Equipment used: Rolling walker (2 wheels) Transfers: Sit to/from Stand Sit to Stand: Min guard           General transfer comment: cues for technique    Ambulation/Gait Ambulation/Gait assistance: Min guard Gait Distance (Feet): 80 Feet Assistive device: Rolling walker (2 wheels) Gait Pattern/deviations: Step-to pattern, Decreased stance time - left, Antalgic       General Gait Details:  verbal cues for sequence, RW positioning, step length; denies dizziness; a few short standing rest breaks due to burning incisional pain   Stairs             Wheelchair Mobility    Modified Rankin (Stroke Patients Only)       Balance                                            Cognition Arousal/Alertness: Awake/alert Behavior During Therapy: WFL for tasks assessed/performed Overall Cognitive Status: Within Functional Limits for tasks assessed                                          Exercises      General Comments        Pertinent Vitals/Pain Pain Assessment Pain Assessment: 0-10 Pain Score: 6  Pain Location: left hip Pain Descriptors / Indicators: Burning Pain Intervention(s): Repositioned, Monitored during session, Premedicated before session    Home Living                          Prior Function            PT Goals (current goals can now be found in the care plan section) Progress towards PT goals: Progressing toward goals    Frequency    Min 1X/week  PT Plan Current plan remains appropriate    Co-evaluation              AM-PAC PT "6 Clicks" Mobility   Outcome Measure  Help needed turning from your back to your side while in a flat bed without using bedrails?: A Little Help needed moving from lying on your back to sitting on the side of a flat bed without using bedrails?: A Little Help needed moving to and from a bed to a chair (including a wheelchair)?: A Little Help needed standing up from a chair using your arms (e.g., wheelchair or bedside chair)?: A Little Help needed to walk in hospital room?: A Little Help needed climbing 3-5 steps with a railing? : A Little 6 Click Score: 18    End of Session Equipment Utilized During Treatment: Gait belt Activity Tolerance: Patient tolerated treatment well Patient left: in chair;with call bell/phone within reach;with chair alarm set Nurse  Communication: Mobility status PT Visit Diagnosis: Difficulty in walking, not elsewhere classified (R26.2)     Time: 1040-1056 PT Time Calculation (min) (ACUTE ONLY): 16 min  Charges:  $Gait Training: 8-22 mins                    Paulino Door, DPT Physical Therapist Acute Rehabilitation Services Office: [phone]    Janan Halter [place] 05/10/2022, 12:58 PM

## 2022-05-11 DIAGNOSIS — S7292XA Unspecified fracture of left femur, initial encounter for closed fracture: Secondary | ICD-10-CM | POA: Diagnosis not present

## 2022-05-11 LAB — IRON AND TIBC
Iron: 15 ug/dL — ABNORMAL LOW (ref 28–170)
Saturation Ratios: 7 % — ABNORMAL LOW (ref 10.4–31.8)
TIBC: 218 ug/dL — ABNORMAL LOW (ref 250–450)
UIBC: 203 ug/dL

## 2022-05-11 LAB — FOLATE: Folate: 15.9 ng/mL (ref >5.9)

## 2022-05-11 LAB — BASIC METABOLIC PANEL
Anion gap: 6 (ref 5–15)
BUN: 18 mg/dL (ref 8–23)
CO2: 26 mmol/L (ref 22–32)
Calcium: 8 mg/dL — ABNORMAL LOW (ref 8.9–10.3)
Chloride: 104 mmol/L (ref 98–111)
Creatinine, Ser: 0.58 mg/dL (ref 0.44–1.00)
GFR, Estimated: >60 mL/min (ref >60)
Glucose, Bld: 105 mg/dL — ABNORMAL HIGH (ref 70–99)
Potassium: 4.1 mmol/L (ref 3.5–5.1)
Sodium: 136 mmol/L (ref 135–145)

## 2022-05-11 LAB — CBC
HCT: 26.4 % — ABNORMAL LOW (ref 36.0–46.0)
Hemoglobin: 8.6 g/dL — ABNORMAL LOW (ref 12.0–15.0)
MCH: 30.1 pg (ref 26.0–34.0)
MCHC: 32.6 g/dL (ref 30.0–36.0)
MCV: 92.3 fL (ref 80.0–100.0)
Platelets: 128 10*3/uL — ABNORMAL LOW (ref 150–400)
RBC: 2.86 MIL/uL — ABNORMAL LOW (ref 3.87–5.11)
RDW: 13.5 % (ref 11.5–15.5)
WBC: 7.1 10*3/uL (ref 4.0–10.5)
nRBC: 0 % (ref 0.0–0.2)

## 2022-05-11 LAB — MAGNESIUM: Magnesium: 2.1 mg/dL (ref 1.7–2.4)

## 2022-05-11 LAB — FERRITIN: Ferritin: 84 ng/mL (ref 11–307)

## 2022-05-11 LAB — PHOSPHORUS: Phosphorus: 2.9 mg/dL (ref 2.5–4.6)

## 2022-05-11 LAB — VITAMIN B12: Vitamin B-12: 432 pg/mL (ref 180–914)

## 2022-05-11 MED ORDER — FERROUS GLUCONATE 324 (38 FE) MG PO TABS: 324.0000 mg | ORAL_TABLET | ORAL | 1 refills | Status: DC

## 2022-05-11 MED ORDER — OXYCODONE HCL 5 MG PO TABS: 5.0000 mg | ORAL_TABLET | Freq: Four times a day (QID) | ORAL | 0 refills | Status: DC | PRN

## 2022-05-11 MED ORDER — ASPIRIN 81 MG PO CHEW: 81.0000 mg | CHEWABLE_TABLET | Freq: Two times a day (BID) | ORAL | 0 refills | Status: AC

## 2022-05-11 NOTE — Plan of Care (Signed)
Patient discharging home with friend Amada Jupiter and brother via private vehicle. Haydee Salter, RN 05/11/22 8:01 PM

## 2022-05-11 NOTE — TOC Transition Note (Signed)
Transition of Care [facility]) - CM/SW Discharge Note  Patient Details  Name: Cheryl Bullock MRN: [id number] Date of Birth: 1940-07-04  [facility] CM/SW Contact:  Ewing Schlein, LCSW Phone Number: 05/11/2022, 11:13 AM  Clinical Narrative: PT and OT evaluations recommended HH. Patient is agreeable to [facility] and requested [facility]. CSW made [facility] referral to Cindie with [facility], which was accepted. HH orders placed. TOC signing off.    Final next level of care: Home w Home Health Services Barriers to Discharge: Barriers Resolved  Patient Goals and CMS Choice CMS Medicare.gov Compare Post Acute Care list provided to:: Patient Choice offered to / list presented to : Patient  Discharge Plan and Services Additional resources added to the After Visit Summary for   In-house Referral: Clinical Social Work      DME Arranged: N/A DME Agency: NA HH Arranged: PT, OT HH Agency: [facility] Date [facility] Agency Contacted: 05/11/22 Representative spoke with at [facility] Agency: Cindie  Social Determinants of Health (SDOH) Interventions SDOH Screenings   Food Insecurity: No Food Insecurity (05/08/2022)  Housing: Low Risk  (05/08/2022)  Transportation Needs: No Transportation Needs (05/08/2022)  Utilities: Not At Risk (05/08/2022)  Tobacco Use: Low Risk  (05/08/2022)   Readmission Risk Interventions     No data to display

## 2022-05-11 NOTE — Progress Notes (Signed)
Physical Therapy Treatment Patient Details Name: Cheryl Bullock MRN: [id number] DOB: January 19, 1941 Today's Date: 05/11/2022   History of Present Illness Pt is an 82 year old female admitted 05/07/22 with Left hip subcapital femoral neck fracture and s/p Left direct anterior THA on 05/08/22.    PT Comments    Pt progressing well, ready for d/c from PT standpoint--after second session  to review single step. D/c plan remains appropriate  Recommendations for follow up therapy are one component of a multi-disciplinary discharge planning process, led by the attending physician.  Recommendations may be updated based on patient status, additional functional criteria and insurance authorization.  Follow Up Recommendations       Assistance Recommended at Discharge PRN  Patient can return home with the following Assistance with cooking/housework;Assist for transportation;Help with stairs or ramp for entrance   Equipment Recommendations  Other (comment) (step dtr bringing her a RW)    Recommendations for Other Services       Precautions / Restrictions Precautions Precautions: Fall Restrictions Weight Bearing Restrictions: No LLE Weight Bearing: Weight bearing as tolerated     Mobility  Bed Mobility Overal bed mobility: Needs Assistance Bed Mobility: Supine to Sit     Supine to sit: Supervision, Modified independent (Device/Increase time)     General bed mobility comments: cues to self assist with leg lifter/gait belt    Transfers Overall transfer level: Needs assistance Equipment used: Rolling walker (2 wheels) Transfers: Sit to/from Stand Sit to Stand: Supervision           General transfer comment: cues for hand placement    Ambulation/Gait Ambulation/Gait assistance: Supervision Gait Distance (Feet): 100 Feet Assistive device: Rolling walker (2 wheels) Gait Pattern/deviations: Step-to pattern, Step-through pattern       General Gait Details: cues for posture, RW  position; demonstrates carryover from previous sessions. progression to step through   [employer] [facility]    Modified Rankin (Stroke Patients Only)       Balance                                            Cognition Arousal/Alertness: Awake/alert Behavior During Therapy: WFL for tasks assessed/performed Overall Cognitive Status: Within Functional Limits for tasks assessed                                          Exercises General Exercises - Lower Extremity Ankle Circles/Pumps: AROM, Both, 5 reps Quad Sets: AROM, 10 reps Heel Slides: AAROM, Seated, Supine, 5 reps, Left    General Comments        Pertinent Vitals/Pain Pain Assessment Pain Assessment: 0-10 Pain Score: 4  Pain Descriptors / Indicators: Burning Pain Intervention(s): Limited activity within patient's tolerance, Monitored during session    Home Living                          Prior Function            PT Goals (current goals can now be found in the care plan section) Acute Rehab PT Goals PT Goal Formulation: With patient Time For Goal Achievement: 05/16/22 Potential to Achieve Goals: Good Progress towards PT goals: Progressing toward  goals    Frequency    Min 1X/week      PT Plan Current plan remains appropriate    Co-evaluation              AM-PAC PT "6 Clicks" Mobility   Outcome Measure  Help needed turning from your back to your side while in a flat bed without using bedrails?: None Help needed moving from lying on your back to sitting on the side of a flat bed without using bedrails?: None Help needed moving to and from a bed to a chair (including a wheelchair)?: None Help needed standing up from a chair using your arms (e.g., wheelchair or bedside chair)?: A Little Help needed to walk in hospital room?: A Little   6 Click Score: 18    End of Session Equipment Utilized During Treatment: Gait  belt Activity Tolerance: Patient tolerated treatment well Patient left: in chair;with call bell/phone within reach;with chair alarm set Nurse Communication: Mobility status PT Visit Diagnosis: Difficulty in walking, not elsewhere classified (R26.2)     Time: 6213-0865 PT Time Calculation (min) (ACUTE ONLY): 23 min  Charges:  $Gait Training: 23-37 mins                     Carlisia Geno, PT  Acute Rehab Dept ([facility]) [phone]  05/11/2022    [facility] 05/11/2022, 11:04 AM

## 2022-05-11 NOTE — Progress Notes (Signed)
Patient ID: Cheryl Bullock, female   DOB: 04-27-1940, 82 y.o.   MRN: [id number] The patient was resting this morning when I came by but she was easily arousable and follows commands appropriately.  Her left operative hip is stable.  I did change the dressing to make sure the incision looks good and it does.  A new Aquacel dressing was placed.  I did review the notes and it sounds like she is making good progress with therapy and her mobility and apparently the plan is to discharge her to home with HHPT and assistance from family and friends given how well she is doing.  Her vital signs are stable and her H&H is stable.  She can be discharged from an orthopedic standpoint.

## 2022-05-11 NOTE — Discharge Summary (Signed)
Physician Discharge Summary  Cheryl Bullock ZOX:[id number] DOB: [birth year] DOA: 05/07/2022  PCP: Gaspar Garbe, MD  Admit date: 05/07/2022 Discharge date: 05/11/2022  Time spent: 40 minutes  Recommendations for Outpatient Follow-up:  Follow outpatient CBC/CMP  Follow anemia, iron def outpatient Follow with orthopedics outpatient as planned   Discharge Diagnoses:  Principal Problem:   Closed left femoral Bullock Active Problems:   GERD (gastroesophageal reflux disease)   Hyperlipidemia   Hypothyroid   Hx of migraine headaches   Closed subcapital Bullock of left femur   Discharge Condition: stable  Diet recommendation: heart healthy  Filed Weights   05/07/22 1904  Weight: 53.1 kg    History of present illness:   Cheryl Bullock is Cheryl Bullock 82 y.o. female with medical history significant of hip arthritis, hyperlipidemia, endometriosis, fibroid, GERD, temporary global admission, impaired hearing, migraine headaches, osteoporosis, scoliosis, hypothyroidism who was transferred from [facility] emergency department after presenting there with Cheryl Bullock fall injuring her left hip with immediate pain and inability to bear weight on her LLE.   Found to have transverse subcapital Bullock of proximal L femur.  Now s/p L direct anterior total hip Bullock.  Stable for discharge home with [facility] services.  See below for additional details  Hospital Course:  Assessment and Plan:  Acute Transverse Subcapital Bullock of Proximal L Femur  S/p L direct anterior total hip Bullock 4/19 ASA 81 mg BID for dvt ppx Appreciate ortho recs Pain regimen, bowel regimen PT/OT --- d/c home with HH   Post Op Blood Loss Anemia  Iron Def Anemia Trend Start iron at discharge   Post Op Hypotension  Orthostasis resolved   GERD PPI   HLD Simvastatin   Hypothyroidism Synthroid 75 mg daily   Migraines Maxalt prn         Procedures: L direct anterior total hip Bullock 4/19     Consultations: orthopedics  Discharge Exam: Vitals:   05/10/22 2100 05/11/22 0545  BP: (!) 101/46 (!) 115/54  Pulse: 70 64  Resp: 18 16  Temp: 98.3 F (36.8 C) 97.8 F (36.6 C)  SpO2: 97% 97%   See progress note  Discharge Instructions   Discharge Instructions     Diet - low sodium heart healthy   Complete by: As directed    Discharge instructions   Complete by: As directed    You were seen for Cheryl Bullock.  You've now had Cheryl Bullock with orthopedics.  We'll send you home with home health services.    Follow with orthopedics as an outpatient as scheduled.   We'll send you with iron for your post op anemia.  You'll need to repeat your blood counts with your PCP as an outpatient in Cheryl Bullock week or two.    Return for new, recurrent, or worsening symptoms. Please ask your PCP to request records from this hospitalization so they know what was done and what the next steps will be.   Increase activity slowly   Complete by: As directed       Allergies as of 05/11/2022       Reactions   Clarithromycin Other (See Comments)   Horrible taste        Medication List     STOP taking these medications    aspirin 81 MG tablet Replaced by: aspirin 81 MG chewable tablet       TAKE these medications    acetaminophen 500 MG tablet Commonly known as: TYLENOL Take 500 mg by  mouth daily.   aspirin 81 MG chewable tablet Chew 1 tablet (81 mg total) by mouth 2 (two) times daily. Replaces: aspirin 81 MG tablet   Calcium Carb-Cholecalciferol 600-500 MG-UNIT Caps Take 1 tablet by mouth daily.   ferrous gluconate 324 MG tablet Commonly known as: FERGON Take 1 tablet (324 mg total) by mouth every other day. Start taking on: May 12, 2022   glucosamine-chondroitin 500-400 MG tablet Take 1 tablet by mouth 2 (two) times daily.   levothyroxine 75 MCG tablet Commonly known as: SYNTHROID Take 75 mcg by mouth daily.   multivitamin capsule Take  1 capsule by mouth daily.   oxyCODONE 5 MG immediate release tablet Commonly known as: Oxy IR/ROXICODONE Take 1 tablet (5 mg total) by mouth every 6 (six) hours as needed for moderate pain (pain score 4-6).   rizatriptan 10 MG tablet Commonly known as: MAXALT Take 10 mg by mouth as needed for migraine.   simvastatin 5 MG tablet Commonly known as: ZOCOR Take 5 mg by mouth daily.   Vitamin D3 50 MCG (2000 UT) capsule Take 2,000 Units by mouth daily.               Durable Medical Equipment  (From admission, onward)           Start     Ordered   05/08/22 2215  DME 3 n 1  Once        05/08/22 2214   05/08/22 2215  DME Walker rolling  Once       Question Answer Comment  Walker: With 5 Inch Wheels   Patient needs [name] walker to treat with the following condition Status post total replacement of left hip      05/08/22 2214           Allergies  Allergen Reactions   Clarithromycin Other (See Comments)    Horrible taste    Follow-up Information     Kathryne Hitch, MD. Schedule an appointment as soon as possible for Cheryl Bullock visit in 2 week(s).   Specialty: Orthopedic Surgery Contact information: [address] [place] [address] [phone]         Tisovec, Cheryl Koh, MD Follow up.   Specialty: Internal Medicine Contact information: [address] [place] [address] [phone]                  The results of significant diagnostics from this hospitalization (including imaging, microbiology, ancillary and laboratory) are listed below for reference.    Significant Diagnostic Studies: DG Pelvis Portable  Result Date: 05/08/2022 CLINICAL DATA:  Postoperative. EXAM: PORTABLE PELVIS 1-2 VIEWS COMPARISON:  Radiograph dated 05/07/2022. FINDINGS: There is Cheryl Bullock total left hip Bullock. The Bullock components appear intact and in anatomic alignment. There is no acute Bullock or dislocation. The bones are osteopenic. There is moderate  arthritic changes of the right hip. Cutaneous staples noted over the left hip. IMPRESSION: Total left hip Bullock. No complicating features. Electronically Signed   By: Elgie Collard M.D.   On: 05/08/2022 22:42   DG HIP UNILAT WITH PELVIS 1V LEFT  Result Date: 05/08/2022 CLINICAL DATA:  Left hip surgery EXAM: DG HIP (WITH OR WITHOUT PELVIS) 1V*L* FLUOROSCOPY TIME:  Fluoroscopy Time:  20 seconds Radiation Exposure Index (if provided by the fluoroscopic device): 2 mGy Number of Acquired Spot Images: 5 COMPARISON:  Radiographs and CT 05/07/2022 FINDINGS: Multiple intraoperative fluoroscopic spot images are provided without Sair Faulcon radiologist present during left THA. IMPRESSION: Left THA.  See op report for details. Electronically Signed   By: Minerva Fester M.D.   On: 05/08/2022 21:06   DG C-Arm 1-60 Min-No Report  Result Date: 05/08/2022 Fluoroscopy was utilized by the requesting physician.  No radiographic interpretation.   DG C-Arm 1-60 Min-No Report  Result Date: 05/08/2022 Fluoroscopy was utilized by the requesting physician.  No radiographic interpretation.   Chest Portable 1 View  Result Date: 05/08/2022 CLINICAL DATA:  Preop for hip Bullock. EXAM: PORTABLE CHEST 1 VIEW COMPARISON:  None Available. FINDINGS: The heart size and mediastinal contours are within normal limits. Both lungs are clear. The visualized skeletal structures are unremarkable. IMPRESSION: No active disease. Electronically Signed   By: Lupita Raider M.D.   On: 05/08/2022 10:36   CT Hip Left Wo Contrast  Result Date: 05/07/2022 CLINICAL DATA:  Proximal left femoral Bullock. EXAM: CT OF THE LEFT HIP WITHOUT CONTRAST TECHNIQUE: Multidetector CT imaging of the left hip was performed according to the standard protocol. Multiplanar CT image reconstructions were also generated. RADIATION DOSE REDUCTION: This exam was performed according to the departmental dose-optimization program which includes automated exposure  control, adjustment of the mA and/or kV according to patient size and/or use of iterative reconstruction technique. COMPARISON:  Left hip and femur films earlier today. FINDINGS: Bones/Joint/Cartilage Osteopenia. There is an acute transverse subcapital Bullock of the proximal left femur with impaction and with slight anterior translation of the main distal fragment. There are Larone Kliethermes few tiny comminution fragments along the posterior Bullock margin. No Bullock is seen of the acetabulum, of the left pelvic ring or the visualized portion of the left hemisacrum. There is joint space loss at the left hip with acetabular and femoral head osteophytes and labral chondrocalcinosis. There are degenerative subcortical cystic changes along the acetabulum greatest superiorly. The joint space loss is greatest superiorly where it is bone-on-bone. There is spurring and sclerosis with subcortical cystic changes at the symphysis pubis and calcified pannus in the joint space. Mild spurring of the visualized left SI joint. Ligaments Suboptimally assessed by CT. Muscles and Tendons No intramuscular hematoma. Area tendons are not well seen with this technique but unremarkable as far as visualized. Soft tissues There is moderate stranding subcutaneously in the lateral left hip area at the level of the greater trochanter. No space-occupying hematoma. Visualized soft tissue structures within the left hemipelvis show no acute findings. IMPRESSION: 1. Acute transverse subcapital Bullock of the proximal left femur with impaction and with slight anterior translation of the main distal fragment. 2. No Bullock of the acetabulum, of the left pelvic ring or the visualized portion of the left hemisacrum. 3. Osteopenia and degenerative change. 4. Moderate subcutaneous stranding in the lateral left hip area at the level of the greater trochanter. No space-occupying hematoma. Electronically Signed   By: Almira Bar M.D.   On: 05/07/2022 22:14   DG  Pelvis 1-2 Views  Result Date: 05/07/2022 CLINICAL DATA:  Left hip pain, fall today. EXAM: PELVIS - 1-2 VIEW COMPARISON:  None Available. FINDINGS: Cortical offset of the left femoral neck suspicious for nondisplaced Bullock. No additional Bullock of the pelvis. 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 No symphyseal or sacroiliac diastasis. No pubic rami Bullock. IMPRESSION: Cortical offset of the left femoral neck suspicious for nondisplaced Bullock. Electronically Signed   By: Narda Rutherford M.D.   On: 05/07/2022 20:02   DG FEMUR MIN 2 VIEWS LEFT  Result Date: 05/07/2022 CLINICAL DATA:  Left hip pain. Fall. EXAM: LEFT FEMUR 2 VIEWS COMPARISON:  None Available. FINDINGS: Cortical offset of the femoral neck suspicious for nondisplaced Bullock. The distal femur is intact. Knee alignment is maintained. No evident Bullock the pubic rami. IMPRESSION: Cortical offset of the left femoral neck suspicious for nondisplaced Bullock. Electronically Signed   By: Narda Rutherford M.D.   On: 05/07/2022 20:01    Microbiology: Recent Results (from the past 240 hour(s))  Surgical pcr screen     Status: None   Collection Time: 05/08/22  8:56 AM   Specimen: Nasal Mucosa; Nasal Swab  Result Value Ref Range Status   MRSA, PCR NEGATIVE NEGATIVE Final   Staphylococcus aureus NEGATIVE NEGATIVE Final    Comment: (NOTE) The Xpert SA Assay (FDA approved for NASAL specimens in patients 47 years of age and older), is one component of Shriley Joffe comprehensive surveillance program. It is not intended to diagnose infection nor to guide or monitor treatment. Performed at [facility], [address] [place], [place] [address]      Labs: Basic Metabolic Panel: Recent Labs  Lab 05/07/22 2022 05/09/22 0341 05/10/22 0313 05/11/22 0326  NA 137 135 134* 136  K 3.8 4.4 4.0 4.1  CL 101 105 105 104  CO2 24 20* 25 26  GLUCOSE 101* 141* 116*  105*  BUN 25* 18 23 18   CREATININE 0.71 0.70 0.79 0.58  CALCIUM 9.9 8.1* 7.9* 8.0*  MG  --   --  2.3 2.1  PHOS  --   --  1.9* 2.9   Liver Function Tests: Recent Labs  Lab 05/09/22 0341 05/10/22 0313  AST 24 28  ALT 18 16  ALKPHOS 32* 28*  BILITOT 1.0 0.5  PROT 5.8* 4.8*  ALBUMIN 3.0* 2.5*   No results for input(s): "LIPASE", "AMYLASE" in the last 168 hours. No results for input(s): "AMMONIA" in the last 168 hours. CBC: Recent Labs  Lab 05/07/22 2022 05/09/22 0341 05/09/22 1211 05/10/22 0313 05/11/22 0326  WBC 15.8* 10.5  --  8.9 7.1  NEUTROABS 14.0*  --   --  6.4  --   HGB 13.6 11.1* 11.1* 8.8* 8.6*  HCT 40.8 35.1* 33.8* 26.2* 26.4*  MCV 88.9 92.9  --  89.7 92.3  PLT 190 154  --  133* 128*   Cardiac Enzymes: No results for input(s): "CKTOTAL", "CKMB", "CKMBINDEX", "TROPONINI" in the last 168 hours. BNP: BNP (last 3 results) No results for input(s): "BNP" in the last 8760 hours.  ProBNP (last 3 results) No results for input(s): "PROBNP" in the last 8760 hours.  CBG: No results for input(s): "GLUCAP" in the last 168 hours.     Signed:  Lacretia Nicks MD.  [facility] 05/11/2022, 11:04 AM

## 2022-05-11 NOTE — Progress Notes (Signed)
PT NOTE  05/11/22 1300  PT Visit Information  Last PT Received On 05/11/22  Assistance Needed Seen for stair training--review single step for bed entry; continues to make progress with gait distance/tolerance. D/c plan remains appropriate  History of Present Illness Pt is an 82 year old female admitted 05/07/22 with Left hip subcapital femoral neck fracture and s/p Left direct anterior THA on 05/08/22.  Precautions  Precautions Fall  Restrictions  Weight Bearing Restrictions No  LLE Weight Bearing WBAT  Pain Assessment  Pain Assessment 0-10  Pain Score 3  Pain Descriptors / Indicators Burning  Pain Intervention(s) Limited activity within patient's tolerance;Monitored during session  Cognition  Arousal/Alertness Awake/alert  Behavior During Therapy WFL for tasks assessed/performed  Overall Cognitive Status Within Functional Limits for tasks assessed  Bed Mobility  Overal bed mobility Needs Assistance  Bed Mobility Supine to Sit;Sit to Supine  Supine to sit Supervision;Modified independent (Device/Increase time)  Sit to supine Min assist  General bed mobility comments able to self progress LLE  Transfers  Overall transfer level Needs assistance  Equipment used Rolling walker (2 wheels)  Transfers Sit to/from Stand  Sit to Stand Supervision  General transfer comment cues for hand placement  Ambulation/Gait  Ambulation/Gait assistance Supervision  Gait Distance (Feet) 120 Feet (10' more)  Assistive device Rolling walker (2 wheels)  Gait Pattern/deviations Step-to pattern;Step-through pattern  General Gait Details cues for posture, RW position; demonstrates carryover from previous sessions. progression to step through  Stairs Yes  Stairs assistance Min guard  Stair Management Step to pattern;With walker;Backwards  Number of Stairs 1 (x2)  General stair comments verbal cues for sequence and technique. steady, no physical assist  General Comments  General comments (skin  integrity, edema, etc.) assisted pt with UB/LB dressing per pt request, educated on LB "bad" leg in first. pt friend bringing her a reacher  PT - End of Session  Equipment Utilized During Treatment Gait belt  Activity Tolerance Patient tolerated treatment well  Patient left in bed;with bed alarm set;with call bell/phone within reach  Nurse Communication Mobility status   PT - Assessment/Plan  PT Plan Current plan remains appropriate  PT Visit Diagnosis Difficulty in walking, not elsewhere classified (R26.2)  PT Frequency (ACUTE ONLY) Min 1X/week  Follow Up Recommendations Home health PT  Assistance recommended at discharge PRN  Patient can return home with the following Assistance with cooking/housework;Assist for transportation;Help with stairs or ramp for entrance  PT equipment Other (comment) (step dtr bringing her a RW)  AM-PAC PT "6 Clicks" Mobility Outcome Measure (Version 2)  Help needed turning from your back to your side while in a flat bed without using bedrails? 4  Help needed moving from lying on your back to sitting on the side of a flat bed without using bedrails? 4  Help needed moving to and from a bed to a chair (including a wheelchair)? 4  Help needed standing up from a chair using your arms (e.g., wheelchair or bedside chair)? 3  Help needed to walk in hospital room? 3  Help needed climbing 3-5 steps with a railing?  3  6 Click Score 21  Consider Recommendation of Discharge To: Home with no services  PT Goal Progression  Progress towards PT goals Progressing toward goals  Acute Rehab PT Goals  PT Goal Formulation With patient  Time For Goal Achievement 05/16/22  Potential to Achieve Goals Good  PT Time Calculation  PT Start Time (ACUTE ONLY) 1249  PT Stop Time (ACUTE  ONLY) 1316  PT Time Calculation (min) (ACUTE ONLY) 27 min  PT General Charges  $$ ACUTE PT VISIT 1 Visit  PT Treatments  $Gait Training 23-37 mins

## 2022-05-11 NOTE — Progress Notes (Signed)
PROGRESS NOTE    Cheryl Bullock  ZOX:[id number] DOB: February 01, 1940 DOA: 05/07/2022 PCP: Gaspar Garbe, MD  Chief Complaint  Patient presents with   Fall    Brief Narrative:   Cheryl Bullock is Cheryl Bullock 82 y.o. female with medical history significant of hip arthritis, hyperlipidemia, endometriosis, fibroid, GERD, temporary global admission, impaired hearing, migraine headaches, osteoporosis, scoliosis, hypothyroidism who was transferred from [facility] emergency department after presenting there with Cheryl Koelzer fall injuring her left hip with immediate pain and inability to bear weight on her LLE.  No fever, chills or night sweats. No sore throat, rhinorrhea, dyspnea, wheezing or hemoptysis.  No chest pain, palpitations, diaphoresis, PND, orthopnea or pitting edema of the lower extremities.  No appetite changes, abdominal pain, diarrhea, constipation, melena or hematochezia.  No flank pain, dysuria, frequency or hematuria.  No polyuria, polydipsia, polyphagia or blurred vision.   ED course: Initial vital signs were temperature 98.1 F, pulse 73, respiration 18, BP 113/69 mmHg O2 sat 100% on room air.  Patient received 1 tablet of Percocet 5/325 mg p.o. and morphine 2 mg IVP x 2.   Lab work: CBC showed Roshun Klingensmith white count of 15.8, hemoglobin 13.6 g/dL platelets 811.  BMP with Remus Hagedorn glucose of 101 and BUN of 25 mg/deciliter.  Creatinine and electrolytes were normal.   Imaging: Pelvics x-ray and left femur x-ray showed cortical offset of the left femoral neck suspicious for nondisplaced fracture.  Left hip CT scan without contrast showing acute transverse subcapital fracture of the proximal left femur with impaction and slight anterior translation of the main distal fragment.  No fracture of the acetabulum, of the pelvic ring or visualized portions of the left hemisacrum.  There is osteopenia and degenerative change.  Moderate subcutaneous stranding in the lateral left hip area at the level of the greater trochanter.  No  space-occupying hematoma.  Assessment & Plan:   Principal Problem:   Closed left femoral fracture Active Problems:   GERD (gastroesophageal reflux disease)   Hyperlipidemia   Hypothyroid   Hx of migraine headaches   Closed subcapital fracture of left femur  Medically ready for discharge, but not sure safe discharge plan yet.  She lives alone.  Has care lined up for overnights, but no one during the day.  She was anticipating going home tomorrow.  I'll discuss with therapy based on how she does today.  Acute Transverse Subcapital Fracture of Proximal L Femur  S/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 ASA 81 mg BID Appreciate ortho rec Pain regimen, bowel regimen PT/OT   Post Op Blood Loss Anemia  Iron Def Anemia Trend Start iron   Post Op Hypotension  Orthostasis Resolved, unclear exactly what caused this Seems improved  GERD PPI  HLD Simvastatin  Hypothyroidism Synthroid 75 mg daily  Migraines Maxalt prn    DVT prophylaxis: ASA BID Code Status: full Family Communication: none Disposition:   Status is: Inpatient Remains inpatient appropriate because: need for post op therapy, disposition   Consultants:  ortho  Procedures:  S/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  Antimicrobials:  Anti-infectives (From admission, onward)    Start     Dose/Rate Route Frequency Ordered Stop   05/09/22 0130  ceFAZolin (ANCEF) IVPB 1 g/50 mL premix        1 g 100 mL/hr over 30 Minutes Intravenous Every 6 hours 05/08/22 2214 05/09/22 0719   05/08/22 1830  ceFAZolin (ANCEF) IVPB 2g/100 mL premix  2 g 200 mL/hr over 30 Minutes Intravenous  Once 05/08/22 1827 05/08/22 1925   05/08/22 1828  ceFAZolin (ANCEF) 2-4 GM/100ML-% IVPB       Note to Pharmacy: Cheryl Bullock W: cabinet override      05/08/22 1828 05/08/22 1925       Subjective: Complains of pain when up and about Not sure if she'll be ready to discharge today - she was thinking  tomorrow Lives alone, has family/friends lined up for overnights - but no one during the day (Will discuss with therapy/ortho)  Objective: Vitals:   05/10/22 1330 05/10/22 2044 05/10/22 2100 05/11/22 0545  BP: (!) 102/54 (!) 107/45 (!) 101/46 (!) 115/54  Pulse: 73 67 70 64  Resp: Temp: 98.1 F (36.7 C) 98.9 F (37.2 C) 98.3 F (36.8 C) 97.8 F (36.6 C)  TempSrc: Oral Oral Oral Oral  SpO2: 100% 97% 97% 97%  Weight:      Height:        Intake/Output Summary (Last 24 hours) at 05/11/2022 7829 Last data filed at 05/11/2022 0800 Gross per 24 hour  Intake 720 ml  Output 300 ml  Net 420 ml   Filed Weights   05/07/22 1904  Weight: 53.1 kg    Examination:  General: No acute distress. Cardiovascular: RRR Lungs: unlabored Abdomen: Soft, nontender, nondistended  Neurological: Alert and oriented 3. Moves all extremities 4 with equal strength. Cranial nerves II through XII grossly intact. Extremities: dressing to LLE    Data Reviewed: I have personally reviewed following labs and imaging studies  CBC: Recent Labs  Lab 05/07/22 2022 05/09/22 0341 05/09/22 1211 05/10/22 0313 05/11/22 0326  WBC 15.8* 10.5  --  8.9 7.1  NEUTROABS 14.0*  --   --  6.4  --   HGB 13.6 11.1* 11.1* 8.8* 8.6*  HCT 40.8 35.1* 33.8* 26.2* 26.4*  MCV 88.9 92.9  --  89.7 92.3  PLT 190 154  --  133* 128*    Basic Metabolic Panel: Recent Labs  Lab 05/07/22 2022 05/09/22 0341 05/10/22 0313 05/11/22 0326  NA 137 135 134* 136  K 3.8 4.4 4.0 4.1  CL 101 105 105 104  CO2 24 20* 25 26  GLUCOSE 101* 141* 116* 105*  BUN 25* CREATININE 0.71 0.70 0.79 0.58  CALCIUM 9.9 8.1* 7.9* 8.0*  MG  --   --  2.3 2.1  PHOS  --   --  1.9* 2.9    GFR: Estimated Creatinine Clearance: 45.4 mL/min (by C-G formula based on SCr of 0.58 mg/dL).  Liver Function Tests: Recent Labs  Lab 05/09/22 0341 05/10/22 0313  AST 24 28  ALT 18 16  ALKPHOS 32* 28*  BILITOT 1.0 0.5  PROT 5.8*  4.8*  ALBUMIN 3.0* 2.5*    CBG: No results for input(s): "GLUCAP" in the last 168 hours.   Recent Results (from the past 240 hour(s))  Surgical pcr screen     Status: None   Collection Time: 05/08/22  8:56 AM   Specimen: Nasal Mucosa; Nasal Swab  Result Value Ref Range Status   MRSA, PCR NEGATIVE NEGATIVE Final   Staphylococcus aureus NEGATIVE NEGATIVE Final    Comment: (NOTE) The Xpert SA Assay (FDA approved for NASAL specimens in patients 52 years of age and older), is one component of Diamonte Stavely comprehensive surveillance program. It is not intended to diagnose infection nor to guide or monitor treatment. Performed at [facility],  [address] [place], [place] [address]          Radiology Studies: No results found.      Scheduled Meds:  acetaminophen  1,000 mg Oral Q8H   aspirin  81 mg Oral BID   calcium carbonate  1 tablet Oral Q breakfast   cholecalciferol  2,000 Units Oral Daily   docusate sodium  100 mg Oral BID   ferrous gluconate  324 mg Oral QODAY   levothyroxine  75 mcg Oral Daily   pantoprazole  40 mg Oral Daily   phosphorus  500 mg Oral BID   polyethylene glycol  17 g Oral Daily   simvastatin  5 mg Oral Daily   Continuous Infusions:  sodium chloride Stopped (05/11/22 0854)   methocarbamol (ROBAXIN) IV       LOS: 3 days    Time spent: over 30 min    Lacretia Nicks, MD [facility]   To contact the attending provider between 7A-7P or the covering provider during after hours 7P-7A, please log into the web site [email or web address] and access using universal [place] password for that web site. If you do not have the password, please call the hospital operator.  05/11/2022, 9:52 AM

## 2022-05-12 ENCOUNTER — Encounter (HOSPITAL_COMMUNITY): Payer: Self-pay | Admitting: Orthopaedic Surgery

## 2022-05-13 ENCOUNTER — Telehealth: Payer: Self-pay

## 2022-05-13 NOTE — Telephone Encounter (Signed)
I called and advised another doctor sent this in yesterday. She stated she would check with the pharmacy. She also stated she needs a sooner appt. Was told the soonest she can get in for her postop was 5/15. This would put her at almost 4 weeks out. I advised the pt we want to see her at two weeks. I scheduled her with Bronson Curb.

## 2022-05-13 NOTE — Telephone Encounter (Signed)
Patient called concerning a Rx for [facility].  Stated that she received her other Rx's from the pharmacy, but wanted to know if this Rx was sent as well.  I did check the see if pharmacy received Rx, and it was confirmed.  Patient stated that she would try the pharmacy again, but would like a CB to clarify.  Patient had left hip surgery on 05/08/2022. CB# [phone].  Please advise.  Thank you.

## 2022-05-14 ENCOUNTER — Telehealth: Payer: Self-pay | Admitting: Orthopaedic Surgery

## 2022-05-14 DIAGNOSIS — M80052D Age-related osteoporosis with current pathological fracture, left femur, subsequent encounter for fracture with routine healing: Secondary | ICD-10-CM | POA: Diagnosis not present

## 2022-05-14 DIAGNOSIS — K219 Gastro-esophageal reflux disease without esophagitis: Secondary | ICD-10-CM | POA: Diagnosis not present

## 2022-05-14 DIAGNOSIS — E785 Hyperlipidemia, unspecified: Secondary | ICD-10-CM | POA: Diagnosis not present

## 2022-05-14 DIAGNOSIS — G43909 Migraine, unspecified, not intractable, without status migrainosus: Secondary | ICD-10-CM | POA: Diagnosis not present

## 2022-05-14 DIAGNOSIS — M419 Scoliosis, unspecified: Secondary | ICD-10-CM | POA: Diagnosis not present

## 2022-05-14 DIAGNOSIS — M1611 Unilateral primary osteoarthritis, right hip: Secondary | ICD-10-CM | POA: Diagnosis not present

## 2022-05-14 DIAGNOSIS — D509 Iron deficiency anemia, unspecified: Secondary | ICD-10-CM | POA: Diagnosis not present

## 2022-05-14 DIAGNOSIS — E039 Hypothyroidism, unspecified: Secondary | ICD-10-CM | POA: Diagnosis not present

## 2022-05-14 DIAGNOSIS — D62 Acute posthemorrhagic anemia: Secondary | ICD-10-CM | POA: Diagnosis not present

## 2022-05-14 NOTE — Telephone Encounter (Signed)
Cheryl Bullock (PT) called from [facility] requesting orders for weight barring status. Please call Rosanne Ashing on secure line at [phone].

## 2022-05-15 ENCOUNTER — Telehealth: Payer: Self-pay

## 2022-05-15 NOTE — Telephone Encounter (Signed)
Called and advised.

## 2022-05-15 NOTE — Telephone Encounter (Signed)
Patient would like to know if she is allowed to take more than 1 Tylenol and would like some guidance concerning her compression stockings.  Stated that her legs are swollen, but the left is worse than right.  Cb# [phone].  Please advise.  Thank you

## 2022-05-15 NOTE — Telephone Encounter (Signed)
Patient aware normal to have some swelling She denies calf pain.

## 2022-05-18 ENCOUNTER — Telehealth: Payer: Self-pay | Admitting: Orthopaedic Surgery

## 2022-05-18 DIAGNOSIS — G43909 Migraine, unspecified, not intractable, without status migrainosus: Secondary | ICD-10-CM | POA: Diagnosis not present

## 2022-05-18 DIAGNOSIS — D509 Iron deficiency anemia, unspecified: Secondary | ICD-10-CM | POA: Diagnosis not present

## 2022-05-18 DIAGNOSIS — E785 Hyperlipidemia, unspecified: Secondary | ICD-10-CM | POA: Diagnosis not present

## 2022-05-18 DIAGNOSIS — D62 Acute posthemorrhagic anemia: Secondary | ICD-10-CM | POA: Diagnosis not present

## 2022-05-18 DIAGNOSIS — K219 Gastro-esophageal reflux disease without esophagitis: Secondary | ICD-10-CM | POA: Diagnosis not present

## 2022-05-18 DIAGNOSIS — M419 Scoliosis, unspecified: Secondary | ICD-10-CM | POA: Diagnosis not present

## 2022-05-18 DIAGNOSIS — M1611 Unilateral primary osteoarthritis, right hip: Secondary | ICD-10-CM | POA: Diagnosis not present

## 2022-05-18 DIAGNOSIS — M80052D Age-related osteoporosis with current pathological fracture, left femur, subsequent encounter for fracture with routine healing: Secondary | ICD-10-CM | POA: Diagnosis not present

## 2022-05-18 DIAGNOSIS — E039 Hypothyroidism, unspecified: Secondary | ICD-10-CM | POA: Diagnosis not present

## 2022-05-18 NOTE — Telephone Encounter (Signed)
Don from [place] called. He would like verbal orders for OT. 3 visits for functional mobility, pain control and safety. UJ#[phone]

## 2022-05-18 NOTE — Telephone Encounter (Signed)
Verbal order given  

## 2022-05-18 NOTE — Telephone Encounter (Signed)
Jim from [place] called. He would like verbal orders for PT in home 1wk 1x, 2wk 4x, 3wk 4x. Also he would like to know the max dosage for Tylenol? His call back number is [phone]

## 2022-05-19 NOTE — Telephone Encounter (Signed)
Verbal order left on VM  

## 2022-05-22 DIAGNOSIS — M419 Scoliosis, unspecified: Secondary | ICD-10-CM | POA: Diagnosis not present

## 2022-05-22 DIAGNOSIS — K219 Gastro-esophageal reflux disease without esophagitis: Secondary | ICD-10-CM | POA: Diagnosis not present

## 2022-05-22 DIAGNOSIS — M80052D Age-related osteoporosis with current pathological fracture, left femur, subsequent encounter for fracture with routine healing: Secondary | ICD-10-CM | POA: Diagnosis not present

## 2022-05-22 DIAGNOSIS — E039 Hypothyroidism, unspecified: Secondary | ICD-10-CM | POA: Diagnosis not present

## 2022-05-22 DIAGNOSIS — D62 Acute posthemorrhagic anemia: Secondary | ICD-10-CM | POA: Diagnosis not present

## 2022-05-22 DIAGNOSIS — D509 Iron deficiency anemia, unspecified: Secondary | ICD-10-CM | POA: Diagnosis not present

## 2022-05-22 DIAGNOSIS — M1611 Unilateral primary osteoarthritis, right hip: Secondary | ICD-10-CM | POA: Diagnosis not present

## 2022-05-22 DIAGNOSIS — G43909 Migraine, unspecified, not intractable, without status migrainosus: Secondary | ICD-10-CM | POA: Diagnosis not present

## 2022-05-22 DIAGNOSIS — E785 Hyperlipidemia, unspecified: Secondary | ICD-10-CM | POA: Diagnosis not present

## 2022-05-25 ENCOUNTER — Encounter: Payer: Self-pay | Admitting: Physician Assistant

## 2022-05-25 ENCOUNTER — Ambulatory Visit (INDEPENDENT_AMBULATORY_CARE_PROVIDER_SITE_OTHER): Payer: Medicare PPO | Admitting: Physician Assistant

## 2022-05-25 DIAGNOSIS — M1611 Unilateral primary osteoarthritis, right hip: Secondary | ICD-10-CM | POA: Diagnosis not present

## 2022-05-25 DIAGNOSIS — D62 Acute posthemorrhagic anemia: Secondary | ICD-10-CM | POA: Diagnosis not present

## 2022-05-25 DIAGNOSIS — D509 Iron deficiency anemia, unspecified: Secondary | ICD-10-CM | POA: Diagnosis not present

## 2022-05-25 DIAGNOSIS — Z96642 Presence of left artificial hip joint: Secondary | ICD-10-CM

## 2022-05-25 DIAGNOSIS — M419 Scoliosis, unspecified: Secondary | ICD-10-CM | POA: Diagnosis not present

## 2022-05-25 DIAGNOSIS — G43909 Migraine, unspecified, not intractable, without status migrainosus: Secondary | ICD-10-CM | POA: Diagnosis not present

## 2022-05-25 DIAGNOSIS — M80052D Age-related osteoporosis with current pathological fracture, left femur, subsequent encounter for fracture with routine healing: Secondary | ICD-10-CM | POA: Diagnosis not present

## 2022-05-25 DIAGNOSIS — E785 Hyperlipidemia, unspecified: Secondary | ICD-10-CM | POA: Diagnosis not present

## 2022-05-25 DIAGNOSIS — E039 Hypothyroidism, unspecified: Secondary | ICD-10-CM | POA: Diagnosis not present

## 2022-05-25 DIAGNOSIS — K219 Gastro-esophageal reflux disease without esophagitis: Secondary | ICD-10-CM | POA: Diagnosis not present

## 2022-05-25 NOTE — Progress Notes (Signed)
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  Patient underwent a left total hip arthroplasty after mechanical fall which resulted in a femoral neck fracture.  She states overall she is doing well.  She is mild pain.  She is on aspirin twice daily she was on aspirin once daily prior to surgery.  She is only taking Tylenol for pain.  She is using a cane to ambulate.  Review of systems see HPI otherwise negative or noncontributory  Physical exam: General well-developed well-nourished female no acute distress ambulates with a cane. Left hip surgical incisions healing well no signs of infection.  Positive seroma at 50 cc of serosanguineous fluid was aspirated.  Left calf supple dorsiflexion plantarflexion left ankle intact.  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  She is able to get the incision wet.  She will work on scar tissue mobilization.  Questions were encouraged and answered at length.  She can go back on her 81 mg aspirin which she was taking prior to surgery.  Follow-up with [place] in 1 month sooner if there is any questions concerns.

## 2022-05-26 DIAGNOSIS — E039 Hypothyroidism, unspecified: Secondary | ICD-10-CM | POA: Diagnosis not present

## 2022-05-26 DIAGNOSIS — M80052D Age-related osteoporosis with current pathological fracture, left femur, subsequent encounter for fracture with routine healing: Secondary | ICD-10-CM | POA: Diagnosis not present

## 2022-05-26 DIAGNOSIS — E785 Hyperlipidemia, unspecified: Secondary | ICD-10-CM | POA: Diagnosis not present

## 2022-05-26 DIAGNOSIS — D509 Iron deficiency anemia, unspecified: Secondary | ICD-10-CM | POA: Diagnosis not present

## 2022-05-26 DIAGNOSIS — M1611 Unilateral primary osteoarthritis, right hip: Secondary | ICD-10-CM | POA: Diagnosis not present

## 2022-05-26 DIAGNOSIS — G43909 Migraine, unspecified, not intractable, without status migrainosus: Secondary | ICD-10-CM | POA: Diagnosis not present

## 2022-05-26 DIAGNOSIS — M419 Scoliosis, unspecified: Secondary | ICD-10-CM | POA: Diagnosis not present

## 2022-05-26 DIAGNOSIS — K219 Gastro-esophageal reflux disease without esophagitis: Secondary | ICD-10-CM | POA: Diagnosis not present

## 2022-05-26 DIAGNOSIS — D62 Acute posthemorrhagic anemia: Secondary | ICD-10-CM | POA: Diagnosis not present

## 2022-05-27 DIAGNOSIS — M419 Scoliosis, unspecified: Secondary | ICD-10-CM | POA: Diagnosis not present

## 2022-05-27 DIAGNOSIS — G43909 Migraine, unspecified, not intractable, without status migrainosus: Secondary | ICD-10-CM | POA: Diagnosis not present

## 2022-05-27 DIAGNOSIS — M80052D Age-related osteoporosis with current pathological fracture, left femur, subsequent encounter for fracture with routine healing: Secondary | ICD-10-CM | POA: Diagnosis not present

## 2022-05-27 DIAGNOSIS — K219 Gastro-esophageal reflux disease without esophagitis: Secondary | ICD-10-CM | POA: Diagnosis not present

## 2022-05-27 DIAGNOSIS — E785 Hyperlipidemia, unspecified: Secondary | ICD-10-CM | POA: Diagnosis not present

## 2022-05-27 DIAGNOSIS — D62 Acute posthemorrhagic anemia: Secondary | ICD-10-CM | POA: Diagnosis not present

## 2022-05-27 DIAGNOSIS — D509 Iron deficiency anemia, unspecified: Secondary | ICD-10-CM | POA: Diagnosis not present

## 2022-05-27 DIAGNOSIS — M1611 Unilateral primary osteoarthritis, right hip: Secondary | ICD-10-CM | POA: Diagnosis not present

## 2022-05-27 DIAGNOSIS — E039 Hypothyroidism, unspecified: Secondary | ICD-10-CM | POA: Diagnosis not present

## 2022-05-28 ENCOUNTER — Ambulatory Visit (INDEPENDENT_AMBULATORY_CARE_PROVIDER_SITE_OTHER): Payer: Medicare PPO | Admitting: Physician Assistant

## 2022-05-28 ENCOUNTER — Encounter: Payer: Self-pay | Admitting: Physician Assistant

## 2022-05-28 DIAGNOSIS — Z96642 Presence of left artificial hip joint: Secondary | ICD-10-CM

## 2022-05-28 NOTE — Progress Notes (Signed)
HPI: Cheryl Bullock comes in today for recurrent seroma left hip.  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  Otherwise she is doing well.  She has no complaints.  No fevers chills.  Physical exam: Left hip she has good range of motion of the hip.  Surgical incisions healing well no signs of infection.  Positive for seroma.  40 cc of serosanguineous fluids aspirated patient tolerates well.  Plan: She will follow-up with [place] as scheduled in 4 weeks sooner if there is any questions or concerns.

## 2022-05-29 ENCOUNTER — Telehealth: Payer: Self-pay | Admitting: Orthopaedic Surgery

## 2022-05-29 NOTE — Telephone Encounter (Signed)
Called and gave verbal ok

## 2022-05-29 NOTE — Telephone Encounter (Signed)
Don from [place] called. He would like verbal orders for OT 1x 1wk. His call back number is [phone]

## 2022-06-01 DIAGNOSIS — D62 Acute posthemorrhagic anemia: Secondary | ICD-10-CM | POA: Diagnosis not present

## 2022-06-01 DIAGNOSIS — K219 Gastro-esophageal reflux disease without esophagitis: Secondary | ICD-10-CM | POA: Diagnosis not present

## 2022-06-01 DIAGNOSIS — M1611 Unilateral primary osteoarthritis, right hip: Secondary | ICD-10-CM | POA: Diagnosis not present

## 2022-06-01 DIAGNOSIS — E039 Hypothyroidism, unspecified: Secondary | ICD-10-CM | POA: Diagnosis not present

## 2022-06-01 DIAGNOSIS — M80052D Age-related osteoporosis with current pathological fracture, left femur, subsequent encounter for fracture with routine healing: Secondary | ICD-10-CM | POA: Diagnosis not present

## 2022-06-01 DIAGNOSIS — G43909 Migraine, unspecified, not intractable, without status migrainosus: Secondary | ICD-10-CM | POA: Diagnosis not present

## 2022-06-01 DIAGNOSIS — M419 Scoliosis, unspecified: Secondary | ICD-10-CM | POA: Diagnosis not present

## 2022-06-01 DIAGNOSIS — E785 Hyperlipidemia, unspecified: Secondary | ICD-10-CM | POA: Diagnosis not present

## 2022-06-01 DIAGNOSIS — D509 Iron deficiency anemia, unspecified: Secondary | ICD-10-CM | POA: Diagnosis not present

## 2022-06-03 ENCOUNTER — Encounter: Payer: Medicare PPO | Admitting: Orthopaedic Surgery

## 2022-06-04 DIAGNOSIS — M80052D Age-related osteoporosis with current pathological fracture, left femur, subsequent encounter for fracture with routine healing: Secondary | ICD-10-CM | POA: Diagnosis not present

## 2022-06-04 DIAGNOSIS — G43909 Migraine, unspecified, not intractable, without status migrainosus: Secondary | ICD-10-CM | POA: Diagnosis not present

## 2022-06-04 DIAGNOSIS — E785 Hyperlipidemia, unspecified: Secondary | ICD-10-CM | POA: Diagnosis not present

## 2022-06-04 DIAGNOSIS — K219 Gastro-esophageal reflux disease without esophagitis: Secondary | ICD-10-CM | POA: Diagnosis not present

## 2022-06-04 DIAGNOSIS — E039 Hypothyroidism, unspecified: Secondary | ICD-10-CM | POA: Diagnosis not present

## 2022-06-04 DIAGNOSIS — M1611 Unilateral primary osteoarthritis, right hip: Secondary | ICD-10-CM | POA: Diagnosis not present

## 2022-06-04 DIAGNOSIS — D62 Acute posthemorrhagic anemia: Secondary | ICD-10-CM | POA: Diagnosis not present

## 2022-06-04 DIAGNOSIS — D509 Iron deficiency anemia, unspecified: Secondary | ICD-10-CM | POA: Diagnosis not present

## 2022-06-04 DIAGNOSIS — M419 Scoliosis, unspecified: Secondary | ICD-10-CM | POA: Diagnosis not present

## 2022-06-05 DIAGNOSIS — E785 Hyperlipidemia, unspecified: Secondary | ICD-10-CM | POA: Diagnosis not present

## 2022-06-05 DIAGNOSIS — M80052D Age-related osteoporosis with current pathological fracture, left femur, subsequent encounter for fracture with routine healing: Secondary | ICD-10-CM | POA: Diagnosis not present

## 2022-06-05 DIAGNOSIS — M1611 Unilateral primary osteoarthritis, right hip: Secondary | ICD-10-CM | POA: Diagnosis not present

## 2022-06-05 DIAGNOSIS — D62 Acute posthemorrhagic anemia: Secondary | ICD-10-CM | POA: Diagnosis not present

## 2022-06-05 DIAGNOSIS — M419 Scoliosis, unspecified: Secondary | ICD-10-CM | POA: Diagnosis not present

## 2022-06-05 DIAGNOSIS — G43909 Migraine, unspecified, not intractable, without status migrainosus: Secondary | ICD-10-CM | POA: Diagnosis not present

## 2022-06-05 DIAGNOSIS — E039 Hypothyroidism, unspecified: Secondary | ICD-10-CM | POA: Diagnosis not present

## 2022-06-05 DIAGNOSIS — D509 Iron deficiency anemia, unspecified: Secondary | ICD-10-CM | POA: Diagnosis not present

## 2022-06-05 DIAGNOSIS — K219 Gastro-esophageal reflux disease without esophagitis: Secondary | ICD-10-CM | POA: Diagnosis not present

## 2022-06-09 ENCOUNTER — Telehealth: Payer: Self-pay | Admitting: Orthopaedic Surgery

## 2022-06-09 DIAGNOSIS — G43909 Migraine, unspecified, not intractable, without status migrainosus: Secondary | ICD-10-CM | POA: Diagnosis not present

## 2022-06-09 DIAGNOSIS — M419 Scoliosis, unspecified: Secondary | ICD-10-CM | POA: Diagnosis not present

## 2022-06-09 DIAGNOSIS — D62 Acute posthemorrhagic anemia: Secondary | ICD-10-CM | POA: Diagnosis not present

## 2022-06-09 DIAGNOSIS — K219 Gastro-esophageal reflux disease without esophagitis: Secondary | ICD-10-CM | POA: Diagnosis not present

## 2022-06-09 DIAGNOSIS — E039 Hypothyroidism, unspecified: Secondary | ICD-10-CM | POA: Diagnosis not present

## 2022-06-09 DIAGNOSIS — E785 Hyperlipidemia, unspecified: Secondary | ICD-10-CM | POA: Diagnosis not present

## 2022-06-09 DIAGNOSIS — M80052D Age-related osteoporosis with current pathological fracture, left femur, subsequent encounter for fracture with routine healing: Secondary | ICD-10-CM | POA: Diagnosis not present

## 2022-06-09 DIAGNOSIS — D509 Iron deficiency anemia, unspecified: Secondary | ICD-10-CM | POA: Diagnosis not present

## 2022-06-09 DIAGNOSIS — M1611 Unilateral primary osteoarthritis, right hip: Secondary | ICD-10-CM | POA: Diagnosis not present

## 2022-06-09 NOTE — Telephone Encounter (Signed)
[facility] Cheryl Bullock) --patient is no longer is home bound they will no longer nee home Therapy, but will benefit P/T .Marland [phone]

## 2022-06-10 NOTE — Telephone Encounter (Signed)
noted 

## 2022-06-11 ENCOUNTER — Other Ambulatory Visit: Payer: Self-pay

## 2022-06-11 DIAGNOSIS — Z96642 Presence of left artificial hip joint: Secondary | ICD-10-CM

## 2022-06-17 ENCOUNTER — Encounter: Payer: Self-pay | Admitting: Physical Therapy

## 2022-06-17 ENCOUNTER — Ambulatory Visit: Payer: Medicare PPO | Admitting: Physical Therapy

## 2022-06-17 ENCOUNTER — Other Ambulatory Visit: Payer: Self-pay

## 2022-06-17 DIAGNOSIS — R2681 Unsteadiness on feet: Secondary | ICD-10-CM

## 2022-06-17 DIAGNOSIS — M6281 Muscle weakness (generalized): Secondary | ICD-10-CM | POA: Diagnosis not present

## 2022-06-17 NOTE — Therapy (Signed)
OUTPATIENT PHYSICAL THERAPY LOWER EXTREMITY EVALUATION   Patient Name: Cheryl Bullock MRN: [id number] DOB:November 01, 1940, 82 y.o., female Today's Date: 06/17/2022  END OF SESSION:  PT End of Session - 06/17/22 1105     Visit Number 1    Number of Visits 13    Date for PT Re-Evaluation 07/29/22    Authorization Type [employer] MCR    Authorization Time Period 06/17/22 to 07/29/22    Progress Note Due on Visit 10    PT Start Time 1015    PT Stop Time 1056    PT Time Calculation (min) 41 min    Activity Tolerance Patient tolerated treatment well    Behavior During Therapy [facility] for tasks assessed/performed             Past Medical History:  Diagnosis Date   Arthritis, hip    Complication of anesthesia    Elevated cholesterol    Endometriosis    Fibroid    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   TOTAL ABDOMINAL HYSTERECTOMY  01/20/1979   bilat oophorectomy   TOTAL HIP ARTHROPLASTY Left 05/08/2022   Procedure: TOTAL HIP ARTHROPLASTY ANTERIOR APPROACH;  Surgeon: Kathryne Hitch, MD;  Location: [facility];  Service: Orthopedics;  Laterality: Left;   Patient Active Problem List   Diagnosis Date Noted   Closed subcapital fracture of left femur (HCC) 05/08/2022   Closed left femoral fracture (HCC) 05/07/2022   Age-related osteoporosis without current pathological fracture 10/25/2021   Primary osteoarthritis of first carpometacarpal joint of left hand 03/25/2016   GERD (gastroesophageal reflux disease) 05/10/2014   Hyperlipidemia 05/10/2014   Hypothyroid 05/10/2014   Hx of migraine headaches 05/10/2014    PCP: Guerry Bruin MD   REFERRING PROVIDER: Kathryne Hitch, MD  REFERRING DIAG: [phone] (ICD-10-CM) - Status post total replacement of left  hip  THERAPY DIAG:  Muscle weakness (generalized)  Unsteadiness on feet  Rationale for Evaluation and Treatment: Rehabilitation  ONSET DATE: 05/08/22  SUBJECTIVE:   SUBJECTIVE STATEMENT: I had surgery 05/08/22, I fell at home not sure why. I don't know if I stubbed my toe and fell, or if the hip broke and then I fell. Things are going really good since surgery. I have been discharged from HHPT. Its hard for me to get socks/shoes on my left foot, no issues with steps or walking on uneven surfaces in general, no big issues with mobility but its not as easy or as fast as it used to be.   PERTINENT HISTORY: HLD, global amnesia, HOH, osteoporosis, thyroid disease, THA 05/08/22  PAIN:  Are you having pain? No 0/10 now, since getting home from the hospital hasn't been more 1/10  PRECAUTIONS: None direct anterior approach, HOH    WEIGHT BEARING RESTRICTIONS: No WBAT   FALLS:  Has patient fallen in last 6 months? Yes. Number of falls 1, (+) FOF   LIVING ENVIRONMENT: Lives with: lives alone Lives in: House/apartment Stairs: 7 STE in the front B, 4 STE in garage B rails; flight with rails inside  Has following equipment at home: Single point cane, Walker - 2 wheeled, and reacher, sock aide   OCCUPATION: retired   PLOF: Independent, Independent with  basic ADLs, Independent with gait, and Independent with transfers  PATIENT GOALS: be more confident in ability to maneuver safely, get some reassurance that this will not happen again/how to handle if it does happen again  NEXT MD VISIT: Dr. Magnus Ivan June 5th   OBJECTIVE:     PATIENT SURVEYS:  FOTO 62, predicted 68 in 13 visits   COGNITION: Overall cognitive status: Within functional limits for tasks assessed     SENSATION: Some numbness along incision       LOWER EXTREMITY MMT:  MMT Right eval Left eval  Hip flexion 4+ 4-  Hip extension 3 3  Hip abduction 3 3  Hip adduction    Hip internal rotation    Hip external  rotation    Knee flexion 4+ 4+  Knee extension 5 5  Ankle dorsiflexion    Ankle plantarflexion    Ankle inversion    Ankle eversion     (Blank rows = not tested)    FUNCTIONAL TESTS:  5 times sit to stand: 17.75 seconds no UEs but offloaded L LE, twisted at trunk to compensate  Dynamic Gait Index: 16/24  GAIT: Distance walked: in clinic distances  Assistive device utilized: None Level of assistance: Complete Independence Comments: proximal weakness noted, some favoring of L LE noted    TODAY'S TREATMENT:                                                                                                                              DATE:   Eval  Objective measures, appropriate education, care planning   TherEx  Nustep L4 x6 minutes BLEs Bridges + ABD into red TB x5 Clams red TB x5 B Seated marches red TB x5 B     PATIENT EDUCATION:  Education details: exam findings, HEP, POC; PT will work with her on getting up/down from the floor safety, don't try it yet; OKed doing some back exercises she was given in the past by an MD with bed behind/chair in front for safety Person educated: Patient Education method: Explanation, Demonstration, and Handouts Education comprehension: verbalized understanding, returned demonstration, and needs further education  HOME EXERCISE PROGRAM: Access Code: [email or web address] URL: [email or web address] Date: 06/17/2022 Prepared by: Nedra Hai  Exercises - Supine Bridge with Resistance Band  - 2 x daily - 7 x weekly - 1 sets - 10 reps - 2 hold - Clamshell with Resistance  - 2 x daily - 7 x weekly - 1 sets - 10 reps - 2 hold - Seated March with Resistance  - 2 x daily - 7 x weekly - 1 sets - 10 reps - 1 hold  ASSESSMENT:  CLINICAL IMPRESSION: Patient is a 82 y.o. F who was seen today for physical therapy evaluation and treatment for skilled PT care following direct anterior THA 05/08/22.  Overall she's doing quite well, exam largely  reveals impairments in functional strength and balance at this point. Will benefit from skilled PT  services to address all concerns and assist in return to optimal level of function.   OBJECTIVE IMPAIRMENTS: Abnormal gait, decreased activity tolerance, decreased mobility, difficulty walking, and decreased strength.   ACTIVITY LIMITATIONS: squatting, stairs, and locomotion level  PARTICIPATION LIMITATIONS: cleaning, laundry, driving, shopping, community activity, yard work, and church  PERSONAL FACTORS: Age, Behavior pattern, Education, Fitness, Past/current experiences, and Time since onset of injury/illness/exacerbation are also affecting patient's functional outcome.   REHAB POTENTIAL: Excellent  CLINICAL DECISION MAKING: Stable/uncomplicated  EVALUATION COMPLEXITY: Low   GOALS: Goals reviewed with patient? Yes  SHORT TERM GOALS: Target date: 07/08/2022   Will be compliant with appropriate progressive HEP  Baseline: Goal status: INITIAL  2.  Will return to appropriate gym routine to assist in strength and activity tolerance gains  Baseline:  Goal status: INITIAL    LONG TERM GOALS: Target date: 07/29/2022    MMT to improve by at least 1 grade in all weak groups  Baseline:  Goal status: INITIAL  2.  Will score at least 20 on DGI to show improved balance/reduced fall risk  Baseline:  Goal status: INITIAL  3.  Will be able to get down to/up from the floor safely on a Mod(I) basis to allow her to do regular exercise routine and garden  Baseline:  Goal status: INITIAL  4.  FOTO score to improve to goal level by time of DC  Baseline:  Goal status: INITIAL  5.  Will be able to complete 5x STS in 13 seconds or less with no compensations to show improved mobility and strength  Baseline:  Goal status: INITIAL     PLAN:  PT FREQUENCY: 2x/week  PT DURATION: 6 weeks  PLANNED INTERVENTIONS: Therapeutic exercises, Therapeutic activity, Neuromuscular re-education, Gait  training, Self Care, Manual therapy, and Re-evaluation  PLAN FOR NEXT SESSION: functional strengthening and balance training as appropriate, check floor to stand transfers next visit   Nedra Hai PT DPT PN2    Referring diagnosis? Z61.096 (ICD-10-CM) - Status post total replacement of left hip Treatment diagnosis? (if different than referring diagnosis)   Muscle weakness (generalized) M62.81  Unsteadiness on feet R26.81   What was this (referring dx) caused by? [x]  Surgery [x]  Fall []  Ongoing issue []  Arthritis []  Other: ____________  Laterality: []  Rt [x]  Lt []  Both  Check all possible CPT codes:  *CHOOSE 10 OR LESS*    []  97110 (Therapeutic Exercise)  []  92507 (SLP Treatment)  []  97112 (Neuro Re-ed)   []  92526 (Swallowing Treatment)   []  97116 (Gait Training)   []  [id number] (Cognitive Training, 1st 15 minutes) []  97140 (Manual Therapy)   []  97130 (Cognitive Training, each add'l 15 minutes)  []  04540 (Re-evaluation)                              []  Other, List CPT Code ____________  []  97530 (Therapeutic Activities)     []  97535 (Self Care)   [x]  All codes above (97110 - 97535)  []  97012 (Mechanical Traction)  []  97014 (E-stim Unattended)  []  97032 (E-stim manual)  []  97033 (Ionto)  []  97035 (Ultrasound) []  97750 (Physical Performance Training) []  [id number] (Aquatic Therapy) []  97016 (Vasopneumatic Device) []  [id number] (Paraffin) []  97034 (Contrast Bath) []  97597 (Wound Care 1st 20 sq cm) []  97598 (Wound Care each add'l 20 sq cm) []  97760 (Orthotic Fabrication, Fitting, Training Initial) []  [id number] (Prosthetic Management and Training Initial) []  [id number] (Orthotic or Prosthetic Training/  Modification Subsequent)

## 2022-06-19 ENCOUNTER — Ambulatory Visit: Payer: Medicare PPO | Admitting: Physical Therapy

## 2022-06-19 ENCOUNTER — Encounter: Payer: Self-pay | Admitting: Physical Therapy

## 2022-06-19 DIAGNOSIS — R2681 Unsteadiness on feet: Secondary | ICD-10-CM | POA: Diagnosis not present

## 2022-06-19 DIAGNOSIS — M6281 Muscle weakness (generalized): Secondary | ICD-10-CM | POA: Diagnosis not present

## 2022-06-19 NOTE — Therapy (Signed)
OUTPATIENT PHYSICAL THERAPY LOWER EXTREMITY TREATMENT   Patient Name: Cheryl Bullock MRN: [id number] DOB:23-Jul-1940, 82 y.o., female Today's Date: 06/19/2022  END OF SESSION:  PT End of Session - 06/19/22 1021     Visit Number 2    Number of Visits 13    Date for PT Re-Evaluation 07/29/22    Authorization Type [employer] MCR    Authorization Time Period 06/17/22 to 07/29/22    Progress Note Due on Visit 10    PT Start Time 1016    PT Stop Time 1056    PT Time Calculation (min) 40 min    Activity Tolerance Patient tolerated treatment well    Behavior During Therapy [facility] for tasks assessed/performed              Past Medical History:  Diagnosis Date   Arthritis, hip    Complication of anesthesia    Elevated cholesterol    Endometriosis    Fibroid    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   TOTAL ABDOMINAL HYSTERECTOMY  01/20/1979   bilat oophorectomy   TOTAL HIP ARTHROPLASTY Left 05/08/2022   Procedure: TOTAL HIP ARTHROPLASTY ANTERIOR APPROACH;  Surgeon: Kathryne Hitch, MD;  Location: [facility];  Service: Orthopedics;  Laterality: Left;   Patient Active Problem List   Diagnosis Date Noted   Closed subcapital fracture of left femur (HCC) 05/08/2022   Closed left femoral fracture (HCC) 05/07/2022   Age-related osteoporosis without current pathological fracture 10/25/2021   Primary osteoarthritis of first carpometacarpal joint of left hand 03/25/2016   GERD (gastroesophageal reflux disease) 05/10/2014   Hyperlipidemia 05/10/2014   Hypothyroid 05/10/2014   Hx of migraine headaches 05/10/2014    PCP: Guerry Bruin MD   REFERRING PROVIDER: Kathryne Hitch, MD  REFERRING DIAG: [phone] (ICD-10-CM) - Status post total replacement of left  hip  THERAPY DIAG:  Muscle weakness (generalized)  Unsteadiness on feet  Rationale for Evaluation and Treatment: Rehabilitation  ONSET DATE: 05/08/22  SUBJECTIVE:   SUBJECTIVE STATEMENT:  Nothing new going on, have been doing my HEP no problem.   PERTINENT HISTORY: HLD, global amnesia, HOH, osteoporosis, thyroid disease, THA 05/08/22  PAIN:  Are you having pain? No  0/10  PRECAUTIONS: None direct anterior approach, HOH    WEIGHT BEARING RESTRICTIONS: No WBAT   FALLS:  Has patient fallen in last 6 months? Yes. Number of falls 1, (+) FOF   LIVING ENVIRONMENT: Lives with: lives alone Lives in: House/apartment Stairs: 7 STE in the front B, 4 STE in garage B rails; flight with rails inside  Has following equipment at home: Single point cane, Walker - 2 wheeled, and reacher, sock aide   OCCUPATION: retired   PLOF: Independent, Independent with basic ADLs, Independent with gait, and Independent with transfers  PATIENT GOALS: be more confident in ability to maneuver safely, get some reassurance that this will not happen again/how to handle if it does happen again  NEXT MD VISIT: Dr. Magnus Ivan June 5th   OBJECTIVE:     PATIENT SURVEYS:  FOTO 62, predicted 68 in 13 visits   COGNITION: Overall cognitive status: Within functional limits for tasks assessed     SENSATION: Some numbness  along incision       LOWER EXTREMITY MMT:  MMT Right eval Left eval  Hip flexion 4+ 4-  Hip extension 3 3  Hip abduction 3 3  Hip adduction    Hip internal rotation    Hip external rotation    Knee flexion 4+ 4+  Knee extension 5 5  Ankle dorsiflexion    Ankle plantarflexion    Ankle inversion    Ankle eversion     (Blank rows = not tested)    FUNCTIONAL TESTS:  5 times sit to stand: 17.75 seconds no UEs but offloaded L LE, twisted at trunk to compensate  Dynamic Gait Index: 16/24  GAIT: Distance walked: in clinic distances  Assistive device utilized: None Level  of assistance: Complete Independence Comments: proximal weakness noted, some favoring of L LE noted    TODAY'S TREATMENT:                                                                                                                              DATE:   06/19/22  TherEx  Nustep L4x6 minutes BLEs only Bridges + ABD into red TBx15  Sidelying hip ABD x10 B Checked floor to stand and supine to quadruped transfers, all OK and able to complete on independent basis- ok to get on floor at [employer] bridges x10 Shuttle LE press: DL 78# 2N56 SL 21#  3Y86 B (limited by R knee pain and L hip pain with single leg presses)   NMR   Tandem stance blue foam pad 3x30 seconds B Narrow BOS blue foam pad EC 3x30 seconds  Alternating toe taps 6 inch box x30 no UEs solid surface SLS with one foot on 6 inch box solid surface 1x30 seconds B    Eval  Objective measures, appropriate education, care planning   TherEx  Nustep L4 x6 minutes BLEs Bridges + ABD into red TB x5 Clams red TB x5 B Seated marches red TB x5 B     PATIENT EDUCATION:  Education details: exam findings, HEP, POC; PT will work with her on getting up/down from the floor safety, don't try it yet; OKed doing some back exercises she was given in the past by an MD with bed behind/chair in front for safety Person educated: Patient Education method: Explanation, Demonstration, and Handouts Education comprehension: verbalized understanding, returned demonstration, and needs further education  HOME EXERCISE PROGRAM: Access Code: [email or web address] URL: [email or web address] Date: 06/17/2022 Prepared by: Nedra Hai  Exercises - Supine Bridge with Resistance Band  - 2 x daily - 7 x weekly - 1 sets - 10 reps - 2 hold - Clamshell with Resistance  - 2 x daily - 7 x weekly - 1 sets - 10 reps - 2 hold - Seated March with Resistance  - 2 x daily - 7 x weekly - 1 sets - 10 reps - 1 hold  ASSESSMENT:  CLINICAL  IMPRESSION:  [name] arrives doing OK, we  warmed up on the Nustep today, then checked floor to stand transfers this morning, able to do all transfers safely. Otherwise worked on functional strength and balance as per POC. Really motivated to improve, anticipate she will have great outcomes with PT.   OBJECTIVE IMPAIRMENTS: Abnormal gait, decreased activity tolerance, decreased mobility, difficulty walking, and decreased strength.   ACTIVITY LIMITATIONS: squatting, stairs, and locomotion level  PARTICIPATION LIMITATIONS: cleaning, laundry, driving, shopping, community activity, yard work, and church  PERSONAL FACTORS: Age, Behavior pattern, Education, Fitness, Past/current experiences, and Time since onset of injury/illness/exacerbation are also affecting patient's functional outcome.   REHAB POTENTIAL: Excellent  CLINICAL DECISION MAKING: Stable/uncomplicated  EVALUATION COMPLEXITY: Low   GOALS: Goals reviewed with patient? Yes  SHORT TERM GOALS: Target date: 07/08/2022   Will be compliant with appropriate progressive HEP  Baseline: Goal status: INITIAL  2.  Will return to appropriate gym routine to assist in strength and activity tolerance gains  Baseline:  Goal status: INITIAL    LONG TERM GOALS: Target date: 07/29/2022    MMT to improve by at least 1 grade in all weak groups  Baseline:  Goal status: INITIAL  2.  Will score at least 20 on DGI to show improved balance/reduced fall risk  Baseline:  Goal status: INITIAL  3.  Will be able to get down to/up from the floor safely on a Mod(I) basis to allow her to do regular exercise routine and garden  Baseline:  Goal status: INITIAL  4.  FOTO score to improve to goal level by time of DC  Baseline:  Goal status: INITIAL  5.  Will be able to complete 5x STS in 13 seconds or less with no compensations to show improved mobility and strength  Baseline:  Goal status: INITIAL     PLAN:  PT FREQUENCY: 2x/week  PT  DURATION: 6 weeks  PLANNED INTERVENTIONS: Therapeutic exercises, Therapeutic activity, Neuromuscular re-education, Gait training, Self Care, Manual therapy, and Re-evaluation  PLAN FOR NEXT SESSION: functional strengthening and balance training as appropriate, HEP updates PRN   Nedra Hai PT DPT PN2    Referring diagnosis? M57.846 (ICD-10-CM) - Status post total replacement of left hip Treatment diagnosis? (if different than referring diagnosis)   Muscle weakness (generalized) M62.81  Unsteadiness on feet R26.81   What was this (referring dx) caused by? [x]  Surgery [x]  Fall []  Ongoing issue []  Arthritis []  Other: ____________  Laterality: []  Rt [x]  Lt []  Both  Check all possible CPT codes:  *CHOOSE 10 OR LESS*    []  97110 (Therapeutic Exercise)  []  92507 (SLP Treatment)  []  97112 (Neuro Re-ed)   []  92526 (Swallowing Treatment)   []  97116 (Gait Training)   []  [id number] (Cognitive Training, 1st 15 minutes) []  96295 (Manual Therapy)   []  97130 (Cognitive Training, each add'l 15 minutes)  []  97164 (Re-evaluation)                              []  Other, List CPT Code ____________  []  97530 (Therapeutic Activities)     []  97535 (Self Care)   [x]  All codes above (97110 - 97535)  []  97012 (Mechanical Traction)  []  97014 (E-stim Unattended)  []  97032 (E-stim manual)  []  97033 (Ionto)  []  97035 (Ultrasound) []  97750 (Physical Performance Training) []  [id number] (Aquatic Therapy) []  97016 (Vasopneumatic Device) []  [id number] (Paraffin) []  97034 (Contrast Bath) []  97597 (Wound Care 1st 20 sq cm) []  97598 (Wound Care each  add'l 20 sq cm) []  97760 (Orthotic Fabrication, Fitting, Training Initial) []  [id number] (Prosthetic Management and Training Initial) []  [id number] (Orthotic or Prosthetic Training/ Modification Subsequent)

## 2022-06-22 ENCOUNTER — Ambulatory Visit: Payer: Medicare PPO | Admitting: Rehabilitative and Restorative Service Providers"

## 2022-06-22 ENCOUNTER — Encounter: Payer: Self-pay | Admitting: Rehabilitative and Restorative Service Providers"

## 2022-06-22 DIAGNOSIS — M6281 Muscle weakness (generalized): Secondary | ICD-10-CM | POA: Diagnosis not present

## 2022-06-22 DIAGNOSIS — R2681 Unsteadiness on feet: Secondary | ICD-10-CM | POA: Diagnosis not present

## 2022-06-22 NOTE — Therapy (Signed)
OUTPATIENT PHYSICAL THERAPY LOWER EXTREMITY TREATMENT   Patient Name: Cheryl Bullock MRN: [id number] DOB:1940/03/18, 82 y.o., female Today's Date: 06/22/2022  END OF SESSION:  PT End of Session - 06/22/22 1004     Visit Number 3    Number of Visits 13    Date for PT Re-Evaluation 07/29/22    Authorization Type [employer] MCR    Authorization Time Period 06/17/22 to 07/29/22    Authorization - Visit Number 3    Progress Note Due on Visit 10    PT Start Time 1003    PT Stop Time 1046    PT Time Calculation (min) 43 min    Activity Tolerance Patient tolerated treatment well;No increased pain    Behavior During Therapy [facility] for tasks assessed/performed               Past Medical History:  Diagnosis Date   Arthritis, hip    Complication of anesthesia    Elevated cholesterol    Endometriosis    Fibroid    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   TOTAL ABDOMINAL HYSTERECTOMY  01/20/1979   bilat oophorectomy   TOTAL HIP ARTHROPLASTY Left 05/08/2022   Procedure: TOTAL HIP ARTHROPLASTY ANTERIOR APPROACH;  Surgeon: Kathryne Hitch, MD;  Location: [facility];  Service: Orthopedics;  Laterality: Left;   Patient Active Problem List   Diagnosis Date Noted   Closed subcapital fracture of left femur (HCC) 05/08/2022   Closed left femoral fracture (HCC) 05/07/2022   Age-related osteoporosis without current pathological fracture 10/25/2021   Primary osteoarthritis of first carpometacarpal joint of left hand 03/25/2016   GERD (gastroesophageal reflux disease) 05/10/2014   Hyperlipidemia 05/10/2014   Hypothyroid 05/10/2014   Hx of migraine headaches 05/10/2014    PCP: Guerry Bruin MD   REFERRING PROVIDER: Kathryne Hitch, MD  REFERRING DIAG:  [phone] (ICD-10-CM) - Status post total replacement of left hip  THERAPY DIAG:  Muscle weakness (generalized)  Unsteadiness on feet  Rationale for Evaluation and Treatment: Rehabilitation  ONSET DATE: 05/08/22  SUBJECTIVE:   SUBJECTIVE STATEMENT:  Sherin notes left lateral hip pain with exercises, particularly with abduction.  Pain is minimal (2/10) and infrequent.  PERTINENT HISTORY: HLD, global amnesia, HOH, osteoporosis, thyroid disease, THA 05/08/22  PAIN:  Are you having pain? No  0/10  PRECAUTIONS: None direct anterior approach, HOH    WEIGHT BEARING RESTRICTIONS: No WBAT   FALLS:  Has patient fallen in last 6 months? Yes. Number of falls 1, (+) FOF   LIVING ENVIRONMENT: Lives with: lives alone Lives in: House/apartment Stairs: 7 STE in the front B, 4 STE in garage B rails; flight with rails inside  Has following equipment at home: Single point cane, Walker - 2 wheeled, and reacher, sock aide   OCCUPATION: retired   PLOF: Independent, Independent with basic ADLs, Independent with gait, and Independent with transfers  PATIENT GOALS: be more confident in ability to maneuver safely, get some reassurance that this will not happen again/how to handle if it does happen again  NEXT MD VISIT: Dr. Magnus Ivan June 5th   OBJECTIVE:     PATIENT SURVEYS:  FOTO 62, predicted 68 in 13 visits  COGNITION: Overall cognitive status: Within functional limits for tasks assessed     SENSATION: Some numbness along incision       LOWER EXTREMITY MMT:  MMT Right eval Left eval  Hip flexion 4+ 4-  Hip extension 3 3  Hip abduction 3 3  Hip adduction    Hip internal rotation    Hip external rotation    Knee flexion 4+ 4+  Knee extension 5 5  Ankle dorsiflexion    Ankle plantarflexion    Ankle inversion    Ankle eversion     (Blank rows = not tested)    FUNCTIONAL TESTS:  5 times sit to stand: 17.75 seconds no UEs but offloaded L LE, twisted at trunk to  compensate  Dynamic Gait Index: 16/24  GAIT: Distance walked: in clinic distances  Assistive device utilized: None Level of assistance: Complete Independence Comments: proximal weakness noted, some favoring of L LE noted    TODAY'S TREATMENT:                                                                                                                              DATE:  06/21/08 Bridging with red theraband around distal thigh 10 x 5 seconds Side lie clams with Red theraband resistance 5 second hold Seated marching with theraband 10 x 2 seconds Supine IT Band stretch (supine hamstrings stretch with adduction and IR) 5 x 20 seconds left only  Neuromuscular re-education: Tandem balance eyes open; eyes open with head turning and eyes closed 2 x 20 seconds each (wider stance with eyes closed)  Functional Activities: Step-down off 4 and 6 inch step 2 sets of 10 each, slow eccentrics   06/19/22  TherEx  Nustep L4x6 minutes BLEs only Bridges + ABD into red TBx15  Sidelying hip ABD x10 B Checked floor to stand and supine to quadruped transfers, all OK and able to complete on independent basis- ok to get on floor at [employer] bridges x10 Shuttle LE press: DL 16# 1W96 SL 04#  5W09 B (limited by R knee pain and L hip pain with single leg presses)   NMR   Tandem stance blue foam pad 3x30 seconds B Narrow BOS blue foam pad EC 3x30 seconds  Alternating toe taps 6 inch box x30 no UEs solid surface SLS with one foot on 6 inch box solid surface 1x30 seconds B    Eval  Objective measures, appropriate education, care planning   TherEx  Nustep L4 x6 minutes BLEs Bridges + ABD into red TB x5 Clams red TB x5 B Seated marches red TB x5 B     PATIENT EDUCATION:  Education details: exam findings, HEP, POC; PT will work with her on getting up/down from the floor safety, don't try it yet; OKed doing some back exercises she was given in the past by an MD with bed behind/chair in front  for safety Person educated: Patient Education method: Explanation, Demonstration, and Handouts  Education comprehension: verbalized understanding, returned demonstration, and needs further education  HOME EXERCISE PROGRAM: Access Code: [email or web address] URL: [email or web address] Date: 06/17/2022 Prepared by: Nedra Hai  Exercises - Supine Bridge with Resistance Band  - 2 x daily - 7 x weekly - 1 sets - 10 reps - 2 hold - Clamshell with Resistance  - 2 x daily - 7 x weekly - 1 sets - 10 reps - 2 hold - Seated March with Resistance  - 2 x daily - 7 x weekly - 1 sets - 10 reps - 1 hold  ASSESSMENT:  CLINICAL IMPRESSION:  Yamini is making excellent early progress post-THA.  She is assistive device free, has little pain and is doing many things with little restrictions.  She does mention some lateral hip soreness that may be some early phase tendonitis.  Her HEP was slightly modified to avoid overuse to address this.  Jyzelle is on track to meet long-term goals on or before the date established at evaluation (07/29/2022).  OBJECTIVE IMPAIRMENTS: Abnormal gait, decreased activity tolerance, decreased mobility, difficulty walking, and decreased strength.   ACTIVITY LIMITATIONS: squatting, stairs, and locomotion level  PARTICIPATION LIMITATIONS: cleaning, laundry, driving, shopping, community activity, yard work, and church  PERSONAL FACTORS: Age, Behavior pattern, Education, Fitness, Past/current experiences, and Time since onset of injury/illness/exacerbation are also affecting patient's functional outcome.   REHAB POTENTIAL: Excellent  CLINICAL DECISION MAKING: Stable/uncomplicated  EVALUATION COMPLEXITY: Low   GOALS: Goals reviewed with patient? Yes  SHORT TERM GOALS: Target date: 07/08/2022   Will be compliant with appropriate progressive HEP  Baseline: Goal status: Met 06/22/2022  2.  Will return to appropriate gym routine to assist in strength and activity tolerance  gains  Baseline:  Goal status: INITIAL    LONG TERM GOALS: Target date: 07/29/2022    MMT to improve by at least 1 grade in all weak groups  Baseline:  Goal status: INITIAL  2.  Will score at least 20 on DGI to show improved balance/reduced fall risk  Baseline:  Goal status: INITIAL  3.  Will be able to get down to/up from the floor safely on a Mod(I) basis to allow her to do regular exercise routine and garden  Baseline:  Goal status: INITIAL  4.  FOTO score to improve to goal level by time of DC  Baseline:  Goal status: INITIAL  5.  Will be able to complete 5x STS in 13 seconds or less with no compensations to show improved mobility and strength  Baseline:  Goal status: INITIAL     PLAN:  PT FREQUENCY: 2x/week  PT DURATION: 6 weeks  PLANNED INTERVENTIONS: Therapeutic exercises, Therapeutic activity, Neuromuscular re-education, Gait training, Self Care, Manual therapy, and Re-evaluation  PLAN FOR NEXT SESSION: Functional strengthening and balance training as appropriate, HEP updates PRN.  Avoid overuse.  How is lateral hip pain?  Cherlyn Cushing PT, MPT   Referring diagnosis? V40.981 (ICD-10-CM) - Status post total replacement of left hip Treatment diagnosis? (if different than referring diagnosis)   Muscle weakness (generalized) M62.81  Unsteadiness on feet R26.81   What was this (referring dx) caused by? [x]  Surgery [x]  Fall []  Ongoing issue []  Arthritis []  Other: ____________  Laterality: []  Rt [x]  Lt []  Both  Check all possible CPT codes:  *CHOOSE 10 OR LESS*    []  97110 (Therapeutic Exercise)  []  92507 (SLP Treatment)  []  97112 (Neuro Re-ed)   []  92526 (Swallowing Treatment)   []  19147 (Gait Training)   []  [id number] (  Cognitive Training, 1st 15 minutes) []  97140 (Manual Therapy)   []  97130 (Cognitive Training, each add'l 15 minutes)  []  97164 (Re-evaluation)                              []  Other, List CPT Code ____________  []  16109 (Therapeutic  Activities)     []  97535 (Self Care)   [x]  All codes above (97110 - 97535)  []  97012 (Mechanical Traction)  []  97014 (E-stim Unattended)  []  97032 (E-stim manual)  []  97033 (Ionto)  []  97035 (Ultrasound) []  97750 (Physical Performance Training) []  [id number] (Aquatic Therapy) []  97016 (Vasopneumatic Device) []  [id number] (Paraffin) []  97034 (Contrast Bath) []  97597 (Wound Care 1st 20 sq cm) []  97598 (Wound Care each add'l 20 sq cm) []  97760 (Orthotic Fabrication, Fitting, Training Initial) []  [id number] (Prosthetic Management and Training Initial) []  [id number] (Orthotic or Prosthetic Training/ Modification Subsequent)

## 2022-06-24 ENCOUNTER — Encounter: Payer: Self-pay | Admitting: Orthopaedic Surgery

## 2022-06-24 ENCOUNTER — Ambulatory Visit (INDEPENDENT_AMBULATORY_CARE_PROVIDER_SITE_OTHER): Payer: Medicare PPO | Admitting: Orthopaedic Surgery

## 2022-06-24 DIAGNOSIS — Z96642 Presence of left artificial hip joint: Secondary | ICD-10-CM

## 2022-06-24 NOTE — Progress Notes (Signed)
The patient is a young appearing 82 year old female who is now 6 weeks out from a left total hip arthroplasty to treat a displaced left hip femoral neck fracture.  She does state that she is going to [facility] soon to see a specialist as it relates to osteoporosis.  She had been diagnosed with osteoporosis within the last year.  Her primary care physician Dr. Wylene Simmer is recommended this as well and I agree.  She is ambulating without an assistive device.  She is in physical therapy now to work on her balance and coordination and strengthening and she is doing well overall.  Her left operative hip moves smoothly and fluidly without any issues at all.  She did let me know that she had been dealing with some right knee problems and that has been still problematic for her and some of this may be due to compensation on that knee from dealing with her left hip.  She understands that we can look at that with time but she has been improving overall.  She does not report fatigue since surgery.  From my standpoint she will continue to increase her activities as comfort allows.  She can continue outpatient physical therapy since it is helping with her balance and coordination.  I would like to see her back in 6 weeks with a standing AP pelvis and lateral of her left operative hip.  We can also x-ray her right knee if she would like to at that visit.

## 2022-06-25 ENCOUNTER — Encounter: Payer: Medicare PPO | Admitting: Physician Assistant

## 2022-06-25 ENCOUNTER — Telehealth: Payer: Self-pay | Admitting: Orthopaedic Surgery

## 2022-06-25 NOTE — Telephone Encounter (Signed)
Dental office called needing a form faxed over for this patient stating the premed protocol. How long she needs to take them, and when can she have major dental work done ([facility])  Fax number: [phone]

## 2022-06-25 NOTE — Telephone Encounter (Signed)
Note faxed.

## 2022-06-25 NOTE — Therapy (Signed)
OUTPATIENT PHYSICAL THERAPY TREATMENT NOTE   Patient Name: Cheryl Bullock MRN: [id number] DOB:1940-05-26, 82 y.o., female Today's Date: 06/26/2022  END OF SESSION:  PT End of Session - 06/26/22 1008     Visit Number 4    Number of Visits 13    Date for PT Re-Evaluation 07/29/22    Authorization Type [employer] MCR    Authorization Time Period 06/17/22 to 07/29/22    Authorization - Visit Number 4    Authorization - Number of Visits 12    Progress Note Due on Visit 10    PT Start Time 1010    PT Stop Time 1105    PT Time Calculation (min) 55 min    Activity Tolerance Patient tolerated treatment well;No increased pain    Behavior During Therapy [facility] for tasks assessed/performed                Past Medical History:  Diagnosis Date   Arthritis, hip    Complication of anesthesia    Elevated cholesterol    Endometriosis    Fibroid    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   TOTAL ABDOMINAL HYSTERECTOMY  01/20/1979   bilat oophorectomy   TOTAL HIP ARTHROPLASTY Left 05/08/2022   Procedure: TOTAL HIP ARTHROPLASTY ANTERIOR APPROACH;  Surgeon: Kathryne Hitch, MD;  Location: [facility];  Service: Orthopedics;  Laterality: Left;   Patient Active Problem List   Diagnosis Date Noted   Closed subcapital fracture of left femur (HCC) 05/08/2022   Closed left femoral fracture (HCC) 05/07/2022   Age-related osteoporosis without current pathological fracture 10/25/2021   Primary osteoarthritis of first carpometacarpal joint of left hand 03/25/2016   GERD (gastroesophageal reflux disease) 05/10/2014   Hyperlipidemia 05/10/2014   Hypothyroid 05/10/2014   Hx of migraine headaches 05/10/2014    PCP: Guerry Bruin MD   REFERRING PROVIDER: Kathryne Hitch, MD  REFERRING DIAG: [phone] (ICD-10-CM) - Status post total replacement of left hip  THERAPY DIAG:  Muscle weakness (generalized)  Unsteadiness on feet  Rationale for Evaluation and Treatment: Rehabilitation  ONSET DATE: 05/08/22  SUBJECTIVE:   SUBJECTIVE STATEMENT: 06/26/2022 Pt states she is feeling pretty good this morning, felt a bit better after modifications last session. Continues to have mild intermittent lateral hip discomfort. Reports good HEP adherence. No pain at present.    PERTINENT HISTORY: HLD, global amnesia, HOH, osteoporosis, thyroid disease, THA 05/08/22  PAIN:  Are you having pain? No  0/10  PRECAUTIONS: None direct anterior approach, HOH    WEIGHT BEARING RESTRICTIONS: No WBAT   FALLS:  Has patient fallen in last 6 months? Yes. Number of falls 1, (+) FOF   LIVING ENVIRONMENT: Lives with: lives alone Lives in: House/apartment Stairs: 7 STE in the front B, 4 STE in garage B rails; flight with rails inside  Has following equipment at home: Single point cane, Walker - 2 wheeled, and reacher, sock aide   OCCUPATION: retired   PLOF: Independent, Independent with basic ADLs, Independent with gait, and Independent with transfers  PATIENT GOALS: be more confident in ability to maneuver safely, get some reassurance that this will not happen again/how to handle if it does happen again  NEXT MD VISIT: Dr. Magnus Ivan June 5th   OBJECTIVE: (objective measures completed at initial evaluation unless otherwise dated)   PATIENT SURVEYS:  FOTO 62, predicted 68 in 13 visits   COGNITION: Overall cognitive status: Within functional limits for tasks assessed     SENSATION: Some numbness along incision       LOWER EXTREMITY MMT:  MMT Right eval Left eval  Hip flexion 4+ 4-  Hip extension 3 3  Hip abduction 3 3  Hip adduction    Hip internal rotation    Hip external rotation    Knee flexion 4+ 4+  Knee extension 5 5  Ankle dorsiflexion    Ankle  plantarflexion    Ankle inversion    Ankle eversion     (Blank rows = not tested)    FUNCTIONAL TESTS:  5 times sit to stand: 17.75 seconds no UEs but offloaded L LE, twisted at trunk to compensate  Dynamic Gait Index: 16/24  GAIT: Distance walked: in clinic distances  Assistive device utilized: None Level of assistance: Complete Independence Comments: proximal weakness noted, some favoring of L LE noted    TODAY'S TREATMENT:                                                                                                                              DATE:  [facility] Adult PT Treatment:                                                DATE: 06/26/22 Therapeutic Exercise: Nu step L5 BLE during subjective  Seated marches 2x8 BIL cues for trunk posture and reduced compensations Supine bridge with RTB around knees, 12x5sec hold cues for pacing Unresisted clamshell 2x12 LLE only  STS from mat, 2x8 cues for symmetry and appropriate trunk lean 6inch step up 3x8, second and third set with emphasis on eccentric control  Lateral step up 4 inch, LLE only, 2x8 cues for setup and posture Seated adductor isometrics 2x10 cues for posture and setup, home performance HEP update + education, handout provided and reviewed Education on relevant anatomy/physiology as it pertains to exercise in session, HEP, and general exercise outside of sessions   Neuromuscular re-ed: Airex step up LLE leading, 2x12 cues for posture and reduced velocity, CGA for safety, no UE support  Airex tandem stance with head turns each direction, 2x10 each foot forward CGA. Cues for posture and pacing, tactile cues to mitigate COM outside LOS   06/21/08 Bridging with red theraband around distal thigh 10 x 5 seconds Side lie clams with Red theraband resistance 5 second hold Seated marching with theraband 10 x 2 seconds Supine IT Band stretch (supine hamstrings stretch with adduction and IR) 5 x 20 seconds left  only  Neuromuscular re-education: Tandem balance eyes open; eyes open with head  turning and eyes closed 2 x 20 seconds each (wider stance with eyes closed)  Functional Activities: Step-down off 4 and 6 inch step 2 sets of 10 each, slow eccentrics   06/19/22  TherEx  Nustep L4x6 minutes BLEs only Bridges + ABD into red TBx15  Sidelying hip ABD x10 B Checked floor to stand and supine to quadruped transfers, all OK and able to complete on independent basis- ok to get on floor at [employer] bridges x10 Shuttle LE press: DL 11# 9J47 SL 82#  9F62 B (limited by R knee pain and L hip pain with single leg presses)   NMR   Tandem stance blue foam pad 3x30 seconds B Narrow BOS blue foam pad EC 3x30 seconds  Alternating toe taps 6 inch box x30 no UEs solid surface SLS with one foot on 6 inch box solid surface 1x30 seconds B    Eval  Objective measures, appropriate education, care planning   TherEx  Nustep L4 x6 minutes BLEs Bridges + ABD into red TB x5 Clams red TB x5 B Seated marches red TB x5 B     PATIENT EDUCATION:  Education details: rationale for interventions, HEP  Person educated: Patient Education method: Programmer, multimedia, Facilities manager, and Handouts Education comprehension: verbalized understanding, returned demonstration, and needs further education  HOME EXERCISE PROGRAM: Access Code: [email or web address] URL: [email or web address] Date: 06/26/2022 Prepared by: Fransisco Hertz  Exercises - Supine Bridge with Resistance Band  - 2 x daily - 7 x weekly - 1 sets - 10 reps - 2 hold - Clamshell with Resistance  - 2 x daily - 7 x weekly - 1 sets - 10 reps - 2 hold - Seated March with Resistance  - 2 x daily - 7 x weekly - 1 sets - 10 reps - 1 hold - Tandem Stance  - 1 x daily - 7 x weekly - 1 sets - 5 reps - 20 second hold - Seated Hip Adduction Isometrics with Ball  - 2-3 x daily - 7 x weekly - 1 sets - 10 reps  ASSESSMENT:  CLINICAL IMPRESSION: 06/26/2022 Pt  arrives w/o pain, reports improved tolerance to activities with modifications last session. Continues to note some mild lateral hip discomfort with activities primarily involving L hip rotation in either direction. Today focusing on closed chain endurance and eccentric strength as well as postural stability on unstable surfaces, pt tolerates these well with cues as above. Pt continues to demonstrate mild deficits in postural stability and LE strength as expected post operatively but overall continues to progress well. No adverse events, pt departs without pain. Pt departs today's session in no acute distress, all voiced questions/concerns addressed appropriately from PT perspective.     OBJECTIVE IMPAIRMENTS: Abnormal gait, decreased activity tolerance, decreased mobility, difficulty walking, and decreased strength.   ACTIVITY LIMITATIONS: squatting, stairs, and locomotion level  PARTICIPATION LIMITATIONS: cleaning, laundry, driving, shopping, community activity, yard work, and church  PERSONAL FACTORS: Age, Behavior pattern, Education, Fitness, Past/current experiences, and Time since onset of injury/illness/exacerbation are also affecting patient's functional outcome.   REHAB POTENTIAL: Excellent  CLINICAL DECISION MAKING: Stable/uncomplicated  EVALUATION COMPLEXITY: Low   GOALS: Goals reviewed with patient? Yes  SHORT TERM GOALS: Target date: 07/08/2022   Will be compliant with appropriate progressive HEP  Baseline: Goal status: Met 06/22/2022  2.  Will return to appropriate gym routine to assist in strength and activity tolerance gains  Baseline:  Goal status: INITIAL    LONG TERM GOALS: Target date: 07/29/2022  MMT to improve by at least 1 grade in all weak groups  Baseline:  Goal status: INITIAL  2.  Will score at least 20 on DGI to show improved balance/reduced fall risk  Baseline:  Goal status: INITIAL  3.  Will be able to get down to/up from the floor safely on a  Mod(I) basis to allow her to do regular exercise routine and garden  Baseline:  Goal status: INITIAL  4.  FOTO score to improve to goal level by time of DC  Baseline:  Goal status: INITIAL  5.  Will be able to complete 5x STS in 13 seconds or less with no compensations to show improved mobility and strength  Baseline:  Goal status: INITIAL     PLAN:  PT FREQUENCY: 2x/week  PT DURATION: 6 weeks  PLANNED INTERVENTIONS: Therapeutic exercises, Therapeutic activity, Neuromuscular re-education, Gait training, Self Care, Manual therapy, and Re-evaluation  PLAN FOR NEXT SESSION: Functional strengthening and balance training as appropriate, HEP updates PRN.  Continue to monitor lateral hip discomfort and modify accordingly.  Ashley Murrain PT, DPT 06/26/2022 11:15 AM    Referring diagnosis? A12.878 (ICD-10-CM) - Status post total replacement of left hip Treatment diagnosis? (if different than referring diagnosis)   Muscle weakness (generalized) M62.81  Unsteadiness on feet R26.81   What was this (referring dx) caused by? [x]  Surgery [x]  Fall []  Ongoing issue []  Arthritis []  Other: ____________  Laterality: []  Rt [x]  Lt []  Both  Check all possible CPT codes:  *CHOOSE 10 OR LESS*    []  97110 (Therapeutic Exercise)  []  92507 (SLP Treatment)  []  97112 (Neuro Re-ed)   []  92526 (Swallowing Treatment)   []  97116 (Gait Training)   []  [id number] (Cognitive Training, 1st 15 minutes) []  97140 (Manual Therapy)   []  97130 (Cognitive Training, each add'l 15 minutes)  []  97164 (Re-evaluation)                              []  Other, List CPT Code ____________  []  97530 (Therapeutic Activities)     []  97535 (Self Care)   [x]  All codes above (97110 - 97535)  []  97012 (Mechanical Traction)  []  97014 (E-stim Unattended)  []  97032 (E-stim manual)  []  97033 (Ionto)  []  97035 (Ultrasound) []  97750 (Physical Performance Training) []  [id number] (Aquatic Therapy) []  97016 (Vasopneumatic Device) []   [id number] (Paraffin) []  97034 (Contrast Bath) []  97597 (Wound Care 1st 20 sq cm) []  97598 (Wound Care each add'l 20 sq cm) []  97760 (Orthotic Fabrication, Fitting, Training Initial) []  [id number] (Prosthetic Management and Training Initial) []  [id number] (Orthotic or Prosthetic Training/ Modification Subsequent)

## 2022-06-26 ENCOUNTER — Ambulatory Visit: Payer: Medicare PPO | Admitting: Physical Therapy

## 2022-06-26 ENCOUNTER — Encounter: Payer: Self-pay | Admitting: Physical Therapy

## 2022-06-26 DIAGNOSIS — M6281 Muscle weakness (generalized): Secondary | ICD-10-CM

## 2022-06-26 DIAGNOSIS — R2681 Unsteadiness on feet: Secondary | ICD-10-CM | POA: Diagnosis not present

## 2022-06-29 ENCOUNTER — Encounter: Payer: Self-pay | Admitting: Physical Therapy

## 2022-06-29 ENCOUNTER — Ambulatory Visit: Payer: Medicare PPO | Admitting: Physical Therapy

## 2022-06-29 DIAGNOSIS — R2681 Unsteadiness on feet: Secondary | ICD-10-CM

## 2022-06-29 DIAGNOSIS — M6281 Muscle weakness (generalized): Secondary | ICD-10-CM

## 2022-06-29 NOTE — Therapy (Signed)
OUTPATIENT PHYSICAL THERAPY TREATMENT NOTE   Patient Name: Cheryl Bullock MRN: [id number] DOB:Mar 18, 1940, 82 y.o., female Today's Date: 06/29/2022  END OF SESSION:  PT End of Session - 06/29/22 1157     Visit Number 5    Number of Visits 13    Date for PT Re-Evaluation 07/29/22    Authorization Type [employer] MCR    Authorization Time Period 06/17/22 to 07/29/22    Authorization - Number of Visits 12    Progress Note Due on Visit 10    PT Start Time 1105    PT Stop Time 1145    PT Time Calculation (min) 40 min    Activity Tolerance Patient tolerated treatment well;No increased pain    Behavior During Therapy [facility] for tasks assessed/performed                Past Medical History:  Diagnosis Date   Arthritis, hip    Complication of anesthesia    Elevated cholesterol    Endometriosis    Fibroid    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   TOTAL ABDOMINAL HYSTERECTOMY  01/20/1979   bilat oophorectomy   TOTAL HIP ARTHROPLASTY Left 05/08/2022   Procedure: TOTAL HIP ARTHROPLASTY ANTERIOR APPROACH;  Surgeon: Kathryne Hitch, MD;  Location: [facility];  Service: Orthopedics;  Laterality: Left;   Patient Active Problem List   Diagnosis Date Noted   Closed subcapital fracture of left femur (HCC) 05/08/2022   Closed left femoral fracture (HCC) 05/07/2022   Age-related osteoporosis without current pathological fracture 10/25/2021   Primary osteoarthritis of first carpometacarpal joint of left hand 03/25/2016   GERD (gastroesophageal reflux disease) 05/10/2014   Hyperlipidemia 05/10/2014   Hypothyroid 05/10/2014   Hx of migraine headaches 05/10/2014    PCP: Guerry Bruin MD   REFERRING PROVIDER: Kathryne Hitch, MD  REFERRING DIAG: [phone]  (ICD-10-CM) - Status post total replacement of left hip  THERAPY DIAG:  Muscle weakness (generalized)  Unsteadiness on feet  Rationale for Evaluation and Treatment: Rehabilitation  ONSET DATE: 05/08/22  SUBJECTIVE:   SUBJECTIVE STATEMENT: 06/29/22: pt arriving today reporting pain </=1/10. Pt stating her pain is more in the left hip.    PERTINENT HISTORY: HLD, global amnesia, HOH, osteoporosis, thyroid disease, THA 05/08/22  PAIN:  Are you having pain? No  0-1/10, left hip, achy  PRECAUTIONS: None direct anterior approach, HOH    WEIGHT BEARING RESTRICTIONS: No WBAT   FALLS:  Has patient fallen in last 6 months? Yes. Number of falls 1, (+) FOF   LIVING ENVIRONMENT: Lives with: lives alone Lives in: House/apartment Stairs: 7 STE in the front B, 4 STE in garage B rails; flight with rails inside  Has following equipment at home: Single point cane, Walker - 2 wheeled, and reacher, sock aide   OCCUPATION: retired   PLOF: Independent, Independent with basic ADLs, Independent with gait, and Independent with transfers  PATIENT GOALS: be more confident in ability to maneuver safely, get some reassurance that this will not happen again/how to handle if it does happen again  NEXT MD VISIT: Dr. Magnus Ivan June 5th   OBJECTIVE: (objective measures completed at initial evaluation unless otherwise dated)   PATIENT  SURVEYS:  FOTO 62, predicted 68 in 13 visits   COGNITION: Overall cognitive status: Within functional limits for tasks assessed     SENSATION: Some numbness along incision       LOWER EXTREMITY MMT:  MMT Right eval Left eval  Hip flexion 4+ 4-  Hip extension 3 3  Hip abduction 3 3  Hip adduction    Hip internal rotation    Hip external rotation    Knee flexion 4+ 4+  Knee extension 5 5  Ankle dorsiflexion    Ankle plantarflexion    Ankle inversion    Ankle eversion     (Blank rows = not tested)    FUNCTIONAL TESTS:  5 times sit to stand: 17.75  seconds no UEs but offloaded L LE, twisted at trunk to compensate  Dynamic Gait Index: 16/24  GAIT: Distance walked: in clinic distances  Assistive device utilized: None Level of assistance: Complete Independence Comments: proximal weakness noted, some favoring of L LE noted    TODAY'S TREATMENT:    DATE:  [facility] Adult PT Treatment:                                                DATE: 06/29/22 Therapeutic Exercise: Nu step L5 BLE x 7 minutes (seat at 8) Supine bridge x 10 holding 5 sec,  x 10 c ball between knees x 10 holding 5 sec SLR: 2 x 10 Standing calf raises x 15 c UE support Standing hip extension: x 15 c UE support Standing hip abd x 15 c UE support Leg Press: 50# 3 x 10 bil LE's, single LE 25# x 10  Seated hamstring stretch x 3 each LE holding 20-30 sec Neuromuscular re-ed: Step ups x 15 c single UE support Lateral step ups x 15 c UE support Side stepping  20 feet x 2 each direction c close supervision                                                                                                                             DATE:  [facility] Adult PT Treatment:                                                DATE: 06/26/22 Therapeutic Exercise: Nu step L5 BLE during subjective  Seated marches 2x8 BIL cues for trunk posture and reduced compensations Supine bridge with RTB around knees, 12x5sec hold cues for pacing Unresisted clamshell 2x12 LLE only  STS from mat, 2x8 cues for symmetry and appropriate trunk lean 6inch step up 3x8, second and third set with emphasis on eccentric control  Lateral step up 4 inch, LLE only, 2x8 cues for setup  and posture Seated adductor isometrics 2x10 cues for posture and setup, home performance HEP update + education, handout provided and reviewed Education on relevant anatomy/physiology as it pertains to exercise in session, HEP, and general exercise outside of sessions   Neuromuscular re-ed: Airex step up LLE leading, 2x12 cues for posture  and reduced velocity, CGA for safety, no UE support  Airex tandem stance with head turns each direction, 2x10 each foot forward CGA. Cues for posture and pacing, tactile cues to mitigate COM outside LOS   06/21/08 Bridging with red theraband around distal thigh 10 x 5 seconds Side lie clams with Red theraband resistance 5 second hold Seated marching with theraband 10 x 2 seconds Supine IT Band stretch (supine hamstrings stretch with adduction and IR) 5 x 20 seconds left only  Neuromuscular re-education: Tandem balance eyes open; eyes open with head turning and eyes closed 2 x 20 seconds each (wider stance with eyes closed)  Functional Activities: Step-down off 4 and 6 inch step 2 sets of 10 each, slow eccentrics      PATIENT EDUCATION:  Education details: rationale for interventions, HEP  Person educated: Patient Education method: Explanation, Demonstration, and Handouts Education comprehension: verbalized understanding, returned demonstration, and needs further education  HOME EXERCISE PROGRAM: Access Code: [email or web address] URL: [email or web address] Date: 06/26/2022 Prepared by: Fransisco Hertz  Exercises - Supine Bridge with Resistance Band  - 2 x daily - 7 x weekly - 1 sets - 10 reps - 2 hold - Clamshell with Resistance  - 2 x daily - 7 x weekly - 1 sets - 10 reps - 2 hold - Seated March with Resistance  - 2 x daily - 7 x weekly - 1 sets - 10 reps - 1 hold - Tandem Stance  - 1 x daily - 7 x weekly - 1 sets - 5 reps - 20 second hold - Seated Hip Adduction Isometrics with Ball  - 2-3 x daily - 7 x weekly - 1 sets - 10 reps  ASSESSMENT:  CLINICAL IMPRESSION: Pt reporting compliance in her HEP. Pt tolerating all exercises well for NMR and strengthening. Pt requiring minimal verbal and tactile cues to correct technique throughout session. Pt reporting left hip muscle fatigue following leg press. Continue skilled PT interventions to maximize pt function.    OBJECTIVE  IMPAIRMENTS: Abnormal gait, decreased activity tolerance, decreased mobility, difficulty walking, and decreased strength.   ACTIVITY LIMITATIONS: squatting, stairs, and locomotion level  PARTICIPATION LIMITATIONS: cleaning, laundry, driving, shopping, community activity, yard work, and church  PERSONAL FACTORS: Age, Behavior pattern, Education, Fitness, Past/current experiences, and Time since onset of injury/illness/exacerbation are also affecting patient's functional outcome.   REHAB POTENTIAL: Excellent  CLINICAL DECISION MAKING: Stable/uncomplicated  EVALUATION COMPLEXITY: Low   GOALS: Goals reviewed with patient? Yes  SHORT TERM GOALS: Target date: 07/08/2022   Will be compliant with appropriate progressive HEP  Baseline: Goal status: Met 06/22/2022  2.  Will return to appropriate gym routine to assist in strength and activity tolerance gains  Baseline:  Goal status: on-going 6/10   LONG TERM GOALS: Target date: 07/29/2022    MMT to improve by at least 1 grade in all weak groups  Baseline:  Goal status: INITIAL  2.  Will score at least 20 on DGI to show improved balance/reduced fall risk  Baseline:  Goal status: INITIAL  3.  Will be able to get down to/up from the floor safely on a Mod(I) basis to allow her to do regular exercise routine and  garden  Baseline:  Goal status: INITIAL  4.  FOTO score to improve to goal level by time of DC  Baseline:  Goal status: INITIAL  5.  Will be able to complete 5x STS in 13 seconds or less with no compensations to show improved mobility and strength  Baseline:  Goal status: INITIAL     PLAN:  PT FREQUENCY: 2x/week  PT DURATION: 6 weeks  PLANNED INTERVENTIONS: Therapeutic exercises, Therapeutic activity, Neuromuscular re-education, Gait training, Self Care, Manual therapy, and Re-evaluation  PLAN FOR NEXT SESSION: Functional strengthening and balance training as appropriate, HEP updates PRN.  Continue to monitor  lateral hip discomfort and modify accordingly.  Narda Amber, PT, MPT 06/29/22 12:00 PM   06/29/2022 12:00 PM    Referring diagnosis? Z61.096 (ICD-10-CM) - Status post total replacement of left hip Treatment diagnosis? (if different than referring diagnosis)   Muscle weakness (generalized) M62.81  Unsteadiness on feet R26.81   What was this (referring dx) caused by? [x]  Surgery [x]  Fall []  Ongoing issue []  Arthritis []  Other: ____________  Laterality: []  Rt [x]  Lt []  Both  Check all possible CPT codes:  *CHOOSE 10 OR LESS*    []  97110 (Therapeutic Exercise)  []  92507 (SLP Treatment)  []  97112 (Neuro Re-ed)   []  92526 (Swallowing Treatment)   []  97116 (Gait Training)   []  [id number] (Cognitive Training, 1st 15 minutes) []  97140 (Manual Therapy)   []  97130 (Cognitive Training, each add'l 15 minutes)  []  97164 (Re-evaluation)                              []  Other, List CPT Code ____________  []  97530 (Therapeutic Activities)     []  97535 (Self Care)   [x]  All codes above (97110 - 97535)  []  97012 (Mechanical Traction)  []  97014 (E-stim Unattended)  []  97032 (E-stim manual)  []  97033 (Ionto)  []  97035 (Ultrasound) []  97750 (Physical Performance Training) []  [id number] (Aquatic Therapy) []  97016 (Vasopneumatic Device) []  [id number] (Paraffin) []  04540 (Contrast Bath) []  97597 (Wound Care 1st 20 sq cm) []  97598 (Wound Care each add'l 20 sq cm) []  97760 (Orthotic Fabrication, Fitting, Training Initial) []  [id number] (Prosthetic Management and Training Initial) []  [id number] (Orthotic or Prosthetic Training/ Modification Subsequent)

## 2022-07-02 ENCOUNTER — Ambulatory Visit: Payer: Medicare PPO | Admitting: Physical Therapy

## 2022-07-02 ENCOUNTER — Encounter: Payer: Self-pay | Admitting: Physical Therapy

## 2022-07-02 DIAGNOSIS — R2681 Unsteadiness on feet: Secondary | ICD-10-CM | POA: Diagnosis not present

## 2022-07-02 DIAGNOSIS — M6281 Muscle weakness (generalized): Secondary | ICD-10-CM

## 2022-07-02 NOTE — Therapy (Signed)
OUTPATIENT PHYSICAL THERAPY TREATMENT NOTE   Patient Name: Cheryl Bullock MRN: [id number] DOB:09-10-1940, 82 y.o., female Today's Date: 07/02/2022  END OF SESSION:  PT End of Session - 07/02/22 0929     Visit Number 6    Number of Visits 13    Date for PT Re-Evaluation 07/29/22    Authorization Type [employer] M[employer]    Authorization Time Period 06/17/22 to 07/29/22    Authorization - Visit Number 6    Authorization - Number of Visits 12    Progress Note Due on Visit 10    PT Start Time 0930    PT Stop Time 1011    PT Time Calculation (min) 41 min    Activity Tolerance Patient tolerated treatment well    Behavior During Therapy [facility] for tasks assessed/performed                 Past Medical History:  Diagnosis Date   Arthritis, hip    Complication of anesthesia    Elevated cholesterol    Endometriosis    Fibroid    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   TOTAL ABDOMINAL HYSTERECTOMY  01/20/1979   bilat oophorectomy   TOTAL HIP ARTHROPLASTY Left 05/08/2022   Procedure: TOTAL HIP ARTHROPLASTY ANTERIOR APPROACH;  Surgeon: Kathryne Hitch, MD;  Location: [facility];  Service: Orthopedics;  Laterality: Left;   Patient Active Problem List   Diagnosis Date Noted   Closed subcapital fracture of left femur (HCC) 05/08/2022   Closed left femoral fracture (HCC) 05/07/2022   Age-related osteoporosis without current pathological fracture 10/25/2021   Primary osteoarthritis of first carpometacarpal joint of left hand 03/25/2016   GERD (gastroesophageal reflux disease) 05/10/2014   Hyperlipidemia 05/10/2014   Hypothyroid 05/10/2014   Hx of migraine headaches 05/10/2014    PCP: Guerry Bruin MD   REFERRING PROVIDER: Kathryne Hitch,  MD  REFERRING DIAG: [phone] (ICD-10-CM) - Status post total replacement of left hip  THERAPY DIAG:  Muscle weakness (generalized)  Unsteadiness on feet  Rationale for Evaluation and Treatment: Rehabilitation  ONSET DATE: 05/08/22  SUBJECTIVE:   SUBJECTIVE STATEMENT:  Feeling good, still having a little sensitivity but I think that's par for the course. If I overdo, I feel it. I stepped back from doing so much on my own, doing some exercises 1x/week. Saw the doctor recently, he said things are looking good, going back in 6 weeks for an xray.    PERTINENT HISTORY: HLD, global amnesia, HOH, osteoporosis, thyroid disease, THA 05/08/22  PAIN:  Are you having pain? No  0/10  PRECAUTIONS: None direct anterior approach, HOH    WEIGHT BEARING RESTRICTIONS: No WBAT   FALLS:  Has patient fallen in last 6 months? Yes. Number of falls 1, (+) FOF   LIVING ENVIRONMENT: Lives with: lives alone Lives in: House/apartment Stairs: 7 STE in the front B, 4 STE in garage B rails; flight with rails inside  Has following equipment at home: Single point cane, Walker - 2 wheeled, and reacher, sock aide   OCCUPATION: retired   PLOF: Independent, Independent with basic ADLs, Independent with gait, and Independent with transfers  PATIENT GOALS: be more confident in ability to  maneuver safely, get some reassurance that this will not happen again/how to handle if it does happen again  NEXT MD VISIT: Dr. Magnus Ivan June 5th   OBJECTIVE: (objective measures completed at initial evaluation unless otherwise dated)   PATIENT SURVEYS:  FOTO 62, predicted 68 in 13 visits   COGNITION: Overall cognitive status: Within functional limits for tasks assessed     SENSATION: Some numbness along incision       LOWER EXTREMITY MMT:  MMT Right eval Left eval Right 07/02/22 Left 07/02/22  Hip flexion 4+ 4- 4+ 4+  Hip extension 3 3 4 4   Hip abduction 3 3 4+ 4  Hip adduction      Hip internal rotation       Hip external rotation      Knee flexion 4+ 4+ 5 5  Knee extension 5 5 4 4   Ankle dorsiflexion      Ankle plantarflexion      Ankle inversion      Ankle eversion       (Blank rows = not tested)    FUNCTIONAL TESTS:  5 times sit to stand: 17.75 seconds no UEs but offloaded L LE, twisted at trunk to compensate; 07/02/22  14 seconds no UEs but still twisting at trunk/hips to compensate  Dynamic Gait Index: 16/24  GAIT: Distance walked: in clinic distances  Assistive device utilized: None Level of assistance: Complete Independence Comments: proximal weakness noted, some favoring of L LE noted    TODAY'S TREATMENT:    DATE:    07/02/22  Updated objective measures- MMT and 5x sit to stand to assess progression of functional strength and general progress towards goals   TherEx  Nustep L5x6 minutes BLEs only 3 way hip red TB above  knees x10 B STS with red TB above knees x10 cue to not twist when standing up elevated mat table  Hip hikes x12 B Hip hikes + ABD x12 B   NMR  Tandem stance blue foam pad 3x30 seconds EO Lateral toe taps off side of blue foam pad x10 B Backwards steps off of blue foam pad x10 B    Selfcare  Education on productive pain (mm fatigue/ "mm burn") vs non-productive pain (radiating pain, numbness, stabbing, etc), progress in strength, compensations and ways to reduce them at home. Duration of exercises. Progressions of HEP and walking program.        [facility] Adult PT Treatment:                                                DATE: 06/29/22 Therapeutic Exercise: Nu step L5 BLE x 7 minutes (seat at 8) Supine bridge x 10 holding 5 sec,  x 10 c ball between knees x 10 holding 5 sec SLR: 2 x 10 Standing calf raises x 15 c UE support Standing hip extension: x 15 c UE support Standing hip abd x 15 c UE support Leg Press: 50# 3 x 10 bil LE's, single LE 25# x 10  Seated hamstring stretch x 3 each LE holding 20-30 sec Neuromuscular re-ed: Step ups x  15 c single UE support Lateral step ups x 15 c UE support Side stepping  20 feet x 2 each direction c close supervision  [facility]                                                DATE: 06/26/22 Therapeutic Exercise: Nu step L5 BLE during subjective  Seated marches 2x8 BIL cues for trunk posture and reduced compensations Supine bridge with RTB around knees, 12x5sec hold cues for pacing Unresisted clamshell 2x12 LLE only  STS from mat, 2x8 cues for symmetry and appropriate trunk lean 6inch step up 3x8, second and third set with emphasis on eccentric control  Lateral step up 4 inch, LLE only, 2x8 cues for setup and posture Seated adductor isometrics 2x10 cues for posture and setup, home performance HEP update + education, handout provided and reviewed Education on relevant anatomy/physiology as it pertains to exercise in session, HEP, and general exercise outside of sessions   Neuromuscular re-ed: Airex step up LLE leading, 2x12 cues for posture and reduced velocity, CGA for safety, no UE support  Airex tandem stance with head turns each direction, 2x10 each foot forward CGA. Cues for posture and pacing, tactile cues to mitigate COM outside LOS   06/21/08 Bridging with red theraband around distal thigh 10 x 5 seconds Side lie clams with Red theraband resistance 5 second hold Seated marching with theraband 10 x 2 seconds Supine IT Band stretch (supine hamstrings stretch with adduction and IR) 5 x 20 seconds left only  Neuromuscular re-education: Tandem balance eyes open; eyes open with head turning and eyes closed 2 x 20 seconds each (wider stance with eyes closed)  Functional Activities: Step-down off 4 and 6 inch step 2 sets of 10 each, slow eccentrics      PATIENT EDUCATION:  Education details: rationale for interventions, HEP   Person educated: Patient Education method: Explanation, Demonstration, and Handouts Education comprehension: verbalized understanding, returned demonstration, and needs further education  HOME EXERCISE PROGRAM: Access Code: [email or web address] URL: [email or web address] Date: 06/26/2022 Prepared by: Fransisco Hertz  Exercises - Supine Bridge with Resistance Band  - 2 x daily - 7 x weekly - 1 sets - 10 reps - 2 hold - Clamshell with Resistance  - 2 x daily - 7 x weekly - 1 sets - 10 reps - 2 hold - Seated March with Resistance  - 2 x daily - 7 x weekly - 1 sets - 10 reps - 1 hold - Tandem Stance  - 1 x daily - 7 x weekly - 1 sets - 5 reps - 20 second hold - Seated Hip Adduction Isometrics with Ball  - 2-3 x daily - 7 x weekly - 1 sets - 10 reps  ASSESSMENT:  CLINICAL IMPRESSION:   Takishia arrives today doing well, pain in lateral hip has gotten better since she reduced frequency of some exercises at home. Updated objective measures today, otherwise continued focus on functional balance and strengthening as tolerated. Had difficulty describing pain quality today.  Really doing well and seems to be on track towards goals.   OBJECTIVE IMPAIRMENTS: Abnormal gait, decreased activity tolerance, decreased mobility, difficulty walking, and decreased strength.   ACTIVITY LIMITATIONS: squatting, stairs, and locomotion level  PARTICIPATION LIMITATIONS: cleaning, laundry, driving, shopping, community activity, yard work, and church  PERSONAL FACTORS: Age, Behavior pattern, Education, Fitness, Past/current experiences, and Time since onset of injury/illness/exacerbation are also affecting patient's functional outcome.   REHAB POTENTIAL: Excellent  CLINICAL DECISION  MAKING: Stable/uncomplicated  EVALUATION COMPLEXITY: Low   GOALS: Goals reviewed with patient? Yes  SHORT TERM GOALS: Target date: 07/08/2022   Will be compliant with appropriate progressive HEP  Baseline: Goal status: Met  06/22/2022  2.  Will return to appropriate gym routine to assist in strength and activity tolerance gains  Baseline:  Goal status: on-going 6/10   LONG TERM GOALS: Target date: 07/29/2022    MMT to improve by at least 1 grade in all weak groups  Baseline:  Goal status: INITIAL  2.  Will score at least 20 on DGI to show improved balance/reduced fall risk  Baseline:  Goal status: INITIAL  3.  Will be able to get down to/up from the floor safely on a Mod(I) basis to allow her to do regular exercise routine and garden  Baseline:  Goal status: INITIAL  4.  FOTO score to improve to goal level by time of DC  Baseline:  Goal status: INITIAL  5.  Will be able to complete 5x STS in 13 seconds or less with no compensations to show improved mobility and strength  Baseline:  Goal status: INITIAL     PLAN:  PT FREQUENCY: 2x/week  PT DURATION: 6 weeks  PLANNED INTERVENTIONS: Therapeutic exercises, Therapeutic activity, Neuromuscular re-education, Gait training, Self Care, Manual therapy, and Re-evaluation  PLAN FOR NEXT SESSION: Functional strengthening and balance training as appropriate, HEP updates PRN.  Monitor lateral hip discomfort and modify accordingly. Update HEP next visit   Nedra Hai PT DPT PN2    07/02/2022 10:13 AM    Referring diagnosis? Z61.096 (ICD-10-CM) - Status post total replacement of left hip Treatment diagnosis? (if different than referring diagnosis)   Muscle weakness (generalized) M62.81  Unsteadiness on feet R26.81   What was this (referring dx) caused by? [x]  Surgery [x]  Fall []  Ongoing issue []  Arthritis []  Other: ____________  Laterality: []  Rt [x]  Lt []  Both  Check all possible CPT codes:  *CHOOSE 10 OR LESS*    []  97110 (Therapeutic Exercise)  []  92507 (SLP Treatment)  []  97112 (Neuro Re-ed)   []  92526 (Swallowing Treatment)   []  97116 (Gait Training)   []  [id number] (Cognitive Training, 1st 15 minutes) []  97140 (Manual Therapy)   []   97130 (Cognitive Training, each add'l 15 minutes)  []  97164 (Re-evaluation)                              []  Other, List CPT Code ____________  []  97530 (Therapeutic Activities)     []  97535 (Self Care)   [x]  All codes above (97110 - 97535)  []  97012 (Mechanical Traction)  []  97014 (E-stim Unattended)  []  97032 (E-stim manual)  []  97033 (Ionto)  []  97035 (Ultrasound) []  97750 (Physical Performance Training) []  [id number] (Aquatic Therapy) []  97016 (Vasopneumatic Device) []  [id number] (Paraffin) []  97034 (Contrast Bath) []  97597 (Wound Care 1st 20 sq cm) []  97598 (Wound Care each add'l 20 sq cm) []  04540 (Orthotic Fabrication, Fitting, Training Initial) []  [id number] (Prosthetic Management and Training Initial) []  [id number] (Orthotic or Prosthetic Training/ Modification Subsequent)

## 2022-07-06 ENCOUNTER — Ambulatory Visit: Payer: Medicare PPO | Admitting: Rehabilitative and Restorative Service Providers"

## 2022-07-06 ENCOUNTER — Encounter: Payer: Self-pay | Admitting: Rehabilitative and Restorative Service Providers"

## 2022-07-06 DIAGNOSIS — M6281 Muscle weakness (generalized): Secondary | ICD-10-CM

## 2022-07-06 DIAGNOSIS — R2681 Unsteadiness on feet: Secondary | ICD-10-CM | POA: Diagnosis not present

## 2022-07-06 NOTE — Therapy (Signed)
OUTPATIENT PHYSICAL THERAPY TREATMENT NOTE   Patient Name: Cheryl Bullock BEEKER MRN: [id number] DOB:1940-05-29, 82 y.o., female Today's Date: 07/06/2022  END OF SESSION:  PT End of Session - 07/06/22 1016     Visit Number 7    Number of Visits 13    Date for PT Re-Evaluation 07/29/22    Authorization Type [employer] MCR    Authorization Time Period 06/17/22 to 07/29/22    Authorization - Visit Number 7    Authorization - Number of Visits 12    Progress Note Due on Visit 10    PT Start Time 1015    PT Stop Time 1100    PT Time Calculation (min) 45 min    Activity Tolerance Patient tolerated treatment well;No increased pain    Behavior During Therapy [facility] for tasks assessed/performed             Past Medical History:  Diagnosis Date   Arthritis, hip    Complication of anesthesia    Elevated cholesterol    Endometriosis    Fibroid    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   TOTAL ABDOMINAL HYSTERECTOMY  01/20/1979   bilat oophorectomy   TOTAL HIP ARTHROPLASTY Left 05/08/2022   Procedure: TOTAL HIP ARTHROPLASTY ANTERIOR APPROACH;  Surgeon: Kathryne Hitch, MD;  Location: [facility];  Service: Orthopedics;  Laterality: Left;   Patient Active Problem List   Diagnosis Date Noted   Closed subcapital fracture of left femur (HCC) 05/08/2022   Closed left femoral fracture (HCC) 05/07/2022   Age-related osteoporosis without current pathological fracture 10/25/2021   Primary osteoarthritis of first carpometacarpal joint of left hand 03/25/2016   GERD (gastroesophageal reflux disease) 05/10/2014   Hyperlipidemia 05/10/2014   Hypothyroid 05/10/2014   Hx of migraine headaches 05/10/2014    PCP: Guerry Bruin MD   REFERRING PROVIDER: Kathryne Hitch,  MD  REFERRING DIAG: [phone] (ICD-10-CM) - Status post total replacement of left hip  THERAPY DIAG:  Muscle weakness (generalized)  Unsteadiness on feet  Rationale for Evaluation and Treatment: Rehabilitation  ONSET DATE: 05/08/22  SUBJECTIVE:   SUBJECTIVE STATEMENT:  Klowie reports continued HEP compliance.  Besides some scar soreness, she is doing well with her pain.  She notes some right knee and left gluteal pain (not bad) later in the day on active days.   PERTINENT HISTORY: HLD, global amnesia, HOH, osteoporosis, thyroid disease, THA 05/08/22  PAIN:  Are you having pain? No  0/10, soreness later in the day on active days  PRECAUTIONS: None direct anterior approach, HOH    WEIGHT BEARING RESTRICTIONS: No WBAT   FALLS:  Has patient fallen in last 6 months? Yes. Number of falls 1, (+) FOF   LIVING ENVIRONMENT: Lives with: lives alone Lives in: House/apartment Stairs: 7 STE in the front B, 4 STE in garage B rails; flight with rails inside  Has following equipment at home: Single point cane, Walker - 2 wheeled, and reacher, sock aide   OCCUPATION: retired   PLOF: Independent, Independent with basic ADLs, Independent with gait, and Independent with transfers  PATIENT GOALS: be more confident in ability to maneuver safely, get some reassurance that this will not happen again/how to  handle if it does happen again  NEXT MD VISIT: Dr. Magnus Ivan June 5th   OBJECTIVE: (objective measures completed at initial evaluation unless otherwise dated)   PATIENT SURVEYS:  FOTO 62, predicted 68 in 13 visits   COGNITION: Overall cognitive status: Within functional limits for tasks assessed     SENSATION: Some numbness along incision     LOWER EXTREMITY MMT:  MMT Right eval Left eval Right 07/02/22 Left 07/02/22  Hip flexion 4+ 4- 4+ 4+  Hip extension 3 3 4 4   Hip abduction 3 3 4+ 4  Hip adduction      Hip internal rotation      Hip external rotation      Knee flexion 4+  4+ 5 5  Knee extension 5 5 4 4   Ankle dorsiflexion      Ankle plantarflexion      Ankle inversion      Ankle eversion       (Blank rows = not tested)    FUNCTIONAL TESTS:  5 times sit to stand: 17.75 seconds no UEs but offloaded L LE, twisted at trunk to compensate; 07/02/22  14 seconds no UEs but still twisting at trunk/hips to compensate  Dynamic Gait Index: 16/24  GAIT: Distance walked: in clinic distances  Assistive device utilized: None Level of assistance: Complete Independence Comments: proximal weakness noted, some favoring of L LE noted    TODAY'S TREATMENT:    DATE:  07/06/2022 Nu Step Level 6 for 5 minutes legs only Alternating hip hike 2 sets of 10 for 3 seconds Seated straight leg raises 3 sets of 5 for 3 seconds Seated marching with Thera-band Red 10 x 3 seconds Reviewed all exercises verbally and updated frequency to (mostly) 3 x a week to avoid overuse as David has great HEP compliance  Neuromuscular re-education: Tandem balance: eyes open; head turning; eyes closed 2 x 20 seconds each Heel to toe raises with no hands 20 x 3 seconds   07/02/22  Updated objective measures- MMT and 5x sit to stand to assess progression of functional strength and general progress towards goals   TherEx  Nustep L5x6 minutes BLEs only 3 way hip red TB above  knees x10 B STS with red TB above knees x10 cue to not twist when standing up elevated mat table  Hip hikes x12 B Hip hikes + ABD x12 B  NMR  Tandem stance blue foam pad 3x30 seconds EO Lateral toe taps off side of blue foam pad x10 B Backwards steps off of blue foam pad x10 B   Selfcare  Education on productive pain (mm fatigue/ "mm burn") vs non-productive pain (radiating pain, numbness, stabbing, etc), progress in strength, compensations and ways to reduce them at home. Duration of exercises. Progressions of HEP and walking program.    [facility] Adult PT Treatment:                                                 DATE: 06/29/22 Therapeutic Exercise: Nu step L5 BLE x 7 minutes (seat at 8) Supine bridge x 10 holding 5 sec,  x 10 c ball between knees x 10 holding 5 sec SLR: 2 x 10 Standing calf raises x 15 c UE support Standing hip extension: x 15 c UE support Standing hip abd x 15 c UE support Leg Press: 50# 3  x 10 bil LE's, single LE 25# x 10  Seated hamstring stretch x 3 each LE holding 20-30 sec Neuromuscular re-ed: Step ups x 15 c single UE support Lateral step ups x 15 c UE support Side stepping  20 feet x 2 each direction c close supervision                                                                                                                             PATIENT EDUCATION:  Education details: rationale for interventions, HEP  Person educated: Patient Education method: Explanation, Demonstration, and Handouts Education comprehension: verbalized understanding, returned demonstration, and needs further education  HOME EXERCISE PROGRAM: Access Code: [email or web address] URL: [email or web address] Date: 06/26/2022 Prepared by: Fransisco Hertz  Exercises - Supine Bridge with Resistance Band  - 2 x daily - 7 x weekly - 1 sets - 10 reps - 2 hold - Clamshell with Resistance  - 2 x daily - 7 x weekly - 1 sets - 10 reps - 2 hold - Seated March with Resistance  - 2 x daily - 7 x weekly - 1 sets - 10 reps - 1 hold - Tandem Stance  - 1 x daily - 7 x weekly - 1 sets - 5 reps - 20 second hold - Seated Hip Adduction Isometrics with Ball  - 2-3 x daily - 7 x weekly - 1 sets - 10 reps  ASSESSMENT:  CLINICAL IMPRESSION:   Sheley notes start-up stiffness and late day soreness on physically active days are her biggest concerns.  We discussed how start-up stiffness will likely always be there for the first 2-3 steps, although it will improve from this point (first 5-6 steps).  Late day soreness is related to fatigue/weakness and is being addressed with her HEP.  We did reduce the volume of her  daily HEP to give her more rest/recovery time and made some early phase exercises optional to streamline her program to help re-gain strength quicker while avoiding overuse.  Leeda is on schedule to DC into independent rehabilitation in 2-3 visits.  OBJECTIVE IMPAIRMENTS: Abnormal gait, decreased activity tolerance, decreased mobility, difficulty walking, and decreased strength.   ACTIVITY LIMITATIONS: squatting, stairs, and locomotion level  PARTICIPATION LIMITATIONS: cleaning, laundry, driving, shopping, community activity, yard work, and church  PERSONAL FACTORS: Age, Behavior pattern, Education, Fitness, Past/current experiences, and Time since onset of injury/illness/exacerbation are also affecting patient's functional outcome.   REHAB POTENTIAL: Excellent  CLINICAL DECISION MAKING: Stable/uncomplicated  EVALUATION COMPLEXITY: Low   GOALS: Goals reviewed with patient? Yes  SHORT TERM GOALS: Target date: 07/08/2022   Will be compliant with appropriate progressive HEP  Baseline: Goal status: Met 06/22/2022  2.  Will return to appropriate gym routine to assist in strength and activity tolerance gains  Baseline:  Goal status: On-going 07/06/2022   LONG TERM GOALS: Target date: 07/29/2022    MMT to improve by at least 1 grade in all weak groups  Baseline:  Goal  status: INITIAL  2.  Will score at least 20 on DGI to show improved balance/reduced fall risk  Baseline:  Goal status: INITIAL  3.  Will be able to get down to/up from the floor safely on a Mod(I) basis to allow her to do regular exercise routine and garden  Baseline:  Goal status: INITIAL  4.  FOTO score to improve to goal level by time of DC  Baseline:  Goal status: INITIAL  5.  Will be able to complete 5x STS in 13 seconds or less with no compensations to show improved mobility and strength  Baseline:  Goal status: INITIAL     PLAN:  PT FREQUENCY: 2x/week  PT DURATION: 6 weeks  PLANNED  INTERVENTIONS: Therapeutic exercises, Therapeutic activity, Neuromuscular re-education, Gait training, Self Care, Manual therapy, and Re-evaluation  PLAN FOR NEXT SESSION: Functional strengthening and balance training as appropriate.  How was updated HEP?  Monitor lateral hip discomfort and modify accordingly.  Cherlyn Cushing PT MPT    07/06/2022 11:10 AM    Referring diagnosis? Z61.096 (ICD-10-CM) - Status post total replacement of left hip Treatment diagnosis? (if different than referring diagnosis)   Muscle weakness (generalized) M62.81  Unsteadiness on feet R26.81   What was this (referring dx) caused by? [x]  Surgery [x]  Fall []  Ongoing issue []  Arthritis []  Other: ____________  Laterality: []  Rt [x]  Lt []  Both  Check all possible CPT codes:  *CHOOSE 10 OR LESS*    []  97110 (Therapeutic Exercise)  []  92507 (SLP Treatment)  []  97112 (Neuro Re-ed)   []  92526 (Swallowing Treatment)   []  97116 (Gait Training)   []  [id number] (Cognitive Training, 1st 15 minutes) []  97140 (Manual Therapy)   []  97130 (Cognitive Training, each add'l 15 minutes)  []  97164 (Re-evaluation)                              []  Other, List CPT Code ____________  []  97530 (Therapeutic Activities)     []  97535 (Self Care)   [x]  All codes above (97110 - 97535)  []  97012 (Mechanical Traction)  []  97014 (E-stim Unattended)  []  97032 (E-stim manual)  []  97033 (Ionto)  []  97035 (Ultrasound) []  97750 (Physical Performance Training) []  [id number] (Aquatic Therapy) []  97016 (Vasopneumatic Device) []  [id number] (Paraffin) []  97034 (Contrast Bath) []  97597 (Wound Care 1st 20 sq cm) []  97598 (Wound Care each add'l 20 sq cm) []  97760 (Orthotic Fabrication, Fitting, Training Initial) []  [id number] (Prosthetic Management and Training Initial) []  [id number] (Orthotic or Prosthetic Training/ Modification Subsequent)

## 2022-07-07 DIAGNOSIS — M810 Age-related osteoporosis without current pathological fracture: Secondary | ICD-10-CM | POA: Diagnosis not present

## 2022-07-08 NOTE — Therapy (Signed)
OUTPATIENT PHYSICAL THERAPY TREATMENT NOTE   Patient Name: Cheryl Bullock MRN: [id number] DOB:October 21, 1940, 82 y.o., female Today's Date: 07/09/2022  END OF SESSION:  PT End of Session - 07/09/22 1023     Visit Number 8    Number of Visits 13    Date for PT Re-Evaluation 07/29/22    Authorization Type [employer] MCR    Authorization Time Period 06/17/22 to 07/29/22    Authorization - Visit Number 8    Authorization - Number of Visits 12    Progress Note Due on Visit 10    PT Start Time 1023    PT Stop Time 1102    PT Time Calculation (min) 39 min    Activity Tolerance Patient tolerated treatment well;No increased pain    Behavior During Therapy [facility] for tasks assessed/performed              Past Medical History:  Diagnosis Date   Arthritis, hip    Complication of anesthesia    Elevated cholesterol    Endometriosis    Fibroid    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   TOTAL ABDOMINAL HYSTERECTOMY  01/20/1979   bilat oophorectomy   TOTAL HIP ARTHROPLASTY Left 05/08/2022   Procedure: TOTAL HIP ARTHROPLASTY ANTERIOR APPROACH;  Surgeon: Kathryne Hitch, MD;  Location: [facility];  Service: Orthopedics;  Laterality: Left;   Patient Active Problem List   Diagnosis Date Noted   Closed subcapital fracture of left femur (HCC) 05/08/2022   Closed left femoral fracture (HCC) 05/07/2022   Age-related osteoporosis without current pathological fracture 10/25/2021   Primary osteoarthritis of first carpometacarpal joint of left hand 03/25/2016   GERD (gastroesophageal reflux disease) 05/10/2014   Hyperlipidemia 05/10/2014   Hypothyroid 05/10/2014   Hx of migraine headaches 05/10/2014    PCP: Guerry Bruin MD   REFERRING PROVIDER: Kathryne Hitch,  MD  REFERRING DIAG: [phone] (ICD-10-CM) - Status post total replacement of left hip  THERAPY DIAG:  Muscle weakness (generalized)  Unsteadiness on feet  Rationale for Evaluation and Treatment: Rehabilitation  ONSET DATE: 05/08/22  SUBJECTIVE:   SUBJECTIVE STATEMENT: Saw endocrinologist for osteoporosis treatment. Continues to do well overall.     PERTINENT HISTORY: HLD, global amnesia, HOH, osteoporosis, thyroid disease, THA 05/08/22  PAIN:  Are you having pain? No  0/10, soreness later in the day on active days  PRECAUTIONS: None direct anterior approach, HOH    WEIGHT BEARING RESTRICTIONS: No WBAT   FALLS:  Has patient fallen in last 6 months? Yes. Number of falls 1, (+) FOF   LIVING ENVIRONMENT: Lives with: lives alone Lives in: House/apartment Stairs: 7 STE in the front B, 4 STE in garage B rails; flight with rails inside  Has following equipment at home: Single point cane, Walker - 2 wheeled, and reacher, sock aide   OCCUPATION: retired   PLOF: Independent, Independent with basic ADLs, Independent with gait, and Independent with transfers  PATIENT GOALS: be more confident in ability to maneuver safely, get some reassurance that this will not happen again/how to handle if it does happen again  NEXT MD VISIT: Dr. Magnus Ivan June 5th   OBJECTIVE: (objective measures completed at initial evaluation unless  otherwise dated)   PATIENT SURVEYS:  FOTO 62, predicted 39 in 13 visits   COGNITION: Overall cognitive status: Within functional limits for tasks assessed     SENSATION: Some numbness along incision     LOWER EXTREMITY MMT:  MMT Right eval Left eval Right 07/02/22 Left 07/02/22  Hip flexion 4+ 4- 4+ 4+  Hip extension 3 3 4 4   Hip abduction 3 3 4+ 4  Hip adduction      Hip internal rotation      Hip external rotation      Knee flexion 4+ 4+ 5 5  Knee extension 5 5 4 4   Ankle dorsiflexion      Ankle plantarflexion      Ankle inversion      Ankle  eversion       (Blank rows = not tested)    FUNCTIONAL TESTS:  5 times sit to stand: 17.75 seconds no UEs but offloaded L LE, twisted at trunk to compensate; 07/02/22  14 seconds no UEs but still twisting at trunk/hips to compensate  Dynamic Gait Index: 16/24  GAIT: Distance walked: in clinic distances  Assistive device utilized: None Level of assistance: Complete Independence Comments: proximal weakness noted, some favoring of L LE noted    TODAY'S TREATMENT:    DATE:  [facility] Adult PT Treatment:                                                DATE: 07/09/22 Therapeutic Exercise: Fwd step ups in // bars 6 inch 2x8 LLE cues for velocity and pacing  4 inch lateral step ups in // bars 2x8 LL cues for step length and pacing  Significant time spent with education re: relevant anatomy/physiology and basic exercise principles to facilitate increased independence with HEP post discharge  Hip hike on 2 inch step 2x8 BIL cues for comfortable ROM GTB hip abduction 2x5 BIL superset with 2x5 hip ext cues for comfortable ROM HEP handout/education + education    07/06/2022 Nu Step Level 6 for 5 minutes legs only Alternating hip hike 2 sets of 10 for 3 seconds Seated straight leg raises 3 sets of 5 for 3 seconds Seated marching with Thera-band Red 10 x 3 seconds Reviewed all exercises verbally and updated frequency to (mostly) 3 x a week to avoid overuse as Luetta has great HEP compliance  Neuromuscular re-education: Tandem balance: eyes open; head turning; eyes closed 2 x 20 seconds each Heel to toe raises with no hands 20 x 3 seconds   07/02/22  Updated objective measures- MMT and 5x sit to stand to assess progression of functional strength and general progress towards goals   TherEx  Nustep L5x6 minutes BLEs only 3 way hip red TB above  knees x10 B STS with red TB above knees x10 cue to not twist when standing up elevated mat table  Hip hikes x12 B Hip hikes + ABD x12 B  NMR  Tandem  stance blue foam pad 3x30 seconds EO Lateral toe taps off side of blue foam pad x10 B Backwards steps off of blue foam pad x10 B   Selfcare  Education on productive pain (mm fatigue/ "mm burn") vs non-productive pain (radiating pain, numbness, stabbing, etc), progress in strength, compensations and ways to reduce them at home. Duration of exercises. Progressions of HEP and walking program.  [facility] Adult PT Treatment:                                                DATE: 06/29/22 Therapeutic Exercise: Nu step L5 BLE x 7 minutes (seat at 8) Supine bridge x 10 holding 5 sec,  x 10 c ball between knees x 10 holding 5 sec SLR: 2 x 10 Standing calf raises x 15 c UE support Standing hip extension: x 15 c UE support Standing hip abd x 15 c UE support Leg Press: 50# 3 x 10 bil LE's, single LE 25# x 10  Seated hamstring stretch x 3 each LE holding 20-30 sec Neuromuscular re-ed: Step ups x 15 c single UE support Lateral step ups x 15 c UE support Side stepping  20 feet x 2 each direction c close supervision                                                                                                                             PATIENT EDUCATION:  Education details: rationale for interventions, HEP  Person educated: Patient Education method: Explanation, Demonstration, and Handouts Education comprehension: verbalized understanding, returned demonstration, and needs further education  HOME EXERCISE PROGRAM: Access Code: [email or web address] URL: [email or web address] Date: 07/09/2022 Prepared by: Fransisco Hertz  Program Notes - only choose one version of hip hike per workout  Exercises - Clamshell with Resistance  - 1 x daily - 3-4 x weekly - 1 sets - 10 reps - 2 hold - Seated March with Resistance  - 2 x daily - 1 x weekly - 1 sets - 10 reps - 1 hold - Tandem Stance  - 1 x daily - 7 x weekly - 1 sets - 5 reps - 20 second hold - Seated Hip Adduction Isometrics with Ball  - 2-3 x  daily - 1 x weekly - 1 sets - 10 reps - Standing Hip Abduction with Counter Support  - 1 x daily - 3-4 x weekly - 10 reps - Standing Hip Extension with Counter Support  - 1 x daily - 3-4 x weekly - 1 sets - 10 reps - Heel Raises with Counter Support  - 1 x daily - 3-4 x weekly - 10 reps - Seated Hamstring Stretch  - 1 x daily - 7 x weekly - 3 reps - 20-30 seconds hold - Small Range Straight Leg Raise  - 1-2 x daily - 3-4 x weekly - 3-5 sets - 5 reps - 3 seconds hold - Standing Hip Hiking  - 3-5 x daily - 3-4 x weekly - 1 sets - 10 reps - 3 seconds hold - Hip Hiking on Step  - 1-2 x daily - 3-4 x weekly - 1 sets - 8 reps  ASSESSMENT:  CLINICAL IMPRESSION: Pt arrives w/o pain, continues to endorse steady progress.  Today focusing on fostering increased independence w/ management of HEP with education on basic exercise physiology/prescription. Tolerates session well with progression of closed chain activity with fwd/lateral step ups. No adverse events, denies any increase in pain w/ activity. Continuing to progress quite well, will likely be appropriate for discharge in coming visits if continues along current trajectory.    OBJECTIVE IMPAIRMENTS: Abnormal gait, decreased activity tolerance, decreased mobility, difficulty walking, and decreased strength.   ACTIVITY LIMITATIONS: squatting, stairs, and locomotion level  PARTICIPATION LIMITATIONS: cleaning, laundry, driving, shopping, community activity, yard work, and church  PERSONAL FACTORS: Age, Behavior pattern, Education, Fitness, Past/current experiences, and Time since onset of injury/illness/exacerbation are also affecting patient's functional outcome.   REHAB POTENTIAL: Excellent  CLINICAL DECISION MAKING: Stable/uncomplicated  EVALUATION COMPLEXITY: Low   GOALS: Goals reviewed with patient? Yes  SHORT TERM GOALS: Target date: 07/08/2022   Will be compliant with appropriate progressive HEP  Baseline: Goal status: Met  06/22/2022  2.  Will return to appropriate gym routine to assist in strength and activity tolerance gains  Baseline:  Goal status: On-going 07/06/2022   LONG TERM GOALS: Target date: 07/29/2022    MMT to improve by at least 1 grade in all weak groups  Baseline:  Goal status: INITIAL  2.  Will score at least 20 on DGI to show improved balance/reduced fall risk  Baseline:  Goal status: INITIAL  3.  Will be able to get down to/up from the floor safely on a Mod(I) basis to allow her to do regular exercise routine and garden  Baseline:  Goal status: INITIAL  4.  FOTO score to improve to goal level by time of DC  Baseline:  Goal status: INITIAL  5.  Will be able to complete 5x STS in 13 seconds or less with no compensations to show improved mobility and strength  Baseline:  Goal status: INITIAL     PLAN:  PT FREQUENCY: 2x/week  PT DURATION: 6 weeks  PLANNED INTERVENTIONS: Therapeutic exercises, Therapeutic activity, Neuromuscular re-education, Gait training, Self Care, Manual therapy, and Re-evaluation  PLAN FOR NEXT SESSION: continue w/ functional strengthening and hip work. Consider d/c in coming visits if appropriate. Focus on fostering increased independence w/ management of home program  Ashley Murrain PT, DPT 07/09/2022 12:43 PM    Referring diagnosis? Z61.096 (ICD-10-CM) - Status post total replacement of left hip Treatment diagnosis? (if different than referring diagnosis)   Muscle weakness (generalized) M62.81  Unsteadiness on feet R26.81   What was this (referring dx) caused by? [x]  Surgery [x]  Fall []  Ongoing issue []  Arthritis []  Other: ____________  Laterality: []  Rt [x]  Lt []  Both  Check all possible CPT codes:  *CHOOSE 10 OR LESS*    []  97110 (Therapeutic Exercise)  []  92507 (SLP Treatment)  []  97112 (Neuro Re-ed)   []  92526 (Swallowing Treatment)   []  04540 (Gait Training)   []  [id number] (Cognitive Training, 1st 15 minutes) []  97140 (Manual  Therapy)   []  97130 (Cognitive Training, each add'l 15 minutes)  []  97164 (Re-evaluation)                              []  Other, List CPT Code ____________  []  97530 (Therapeutic Activities)     []  97535 (Self Care)   [x]  All codes above (97110 - 97535)  []  97012 (Mechanical Traction)  []  97014 (E-stim Unattended)  []  97032 (E-stim manual)  []  97033 (Ionto)  []   16109 (Ultrasound) []  60454 (Physical Performance Training) []  [id number] (Aquatic Therapy) []  97016 (Vasopneumatic Device) []  [id number] (Paraffin) []  97034 (Contrast Bath) []  [phone] (Wound Care 1st 20 sq cm) []  97598 (Wound Care each add'l 20 sq cm) []  97760 (Orthotic Fabrication, Fitting, Training Initial) []  [id number] (Prosthetic Management and Training Initial) []  [id number] (Orthotic or Prosthetic Training/ Modification Subsequent)

## 2022-07-09 ENCOUNTER — Encounter: Payer: Self-pay | Admitting: Physical Therapy

## 2022-07-09 ENCOUNTER — Ambulatory Visit: Payer: Medicare PPO | Admitting: Physical Therapy

## 2022-07-09 DIAGNOSIS — R2681 Unsteadiness on feet: Secondary | ICD-10-CM | POA: Diagnosis not present

## 2022-07-09 DIAGNOSIS — M6281 Muscle weakness (generalized): Secondary | ICD-10-CM

## 2022-07-13 DIAGNOSIS — M810 Age-related osteoporosis without current pathological fracture: Secondary | ICD-10-CM | POA: Diagnosis not present

## 2022-07-14 ENCOUNTER — Encounter: Payer: Self-pay | Admitting: Physical Therapy

## 2022-07-14 ENCOUNTER — Ambulatory Visit: Payer: Medicare PPO | Admitting: Physical Therapy

## 2022-07-14 DIAGNOSIS — M6281 Muscle weakness (generalized): Secondary | ICD-10-CM | POA: Diagnosis not present

## 2022-07-14 DIAGNOSIS — R2681 Unsteadiness on feet: Secondary | ICD-10-CM

## 2022-07-14 NOTE — Therapy (Signed)
OUTPATIENT PHYSICAL THERAPY TREATMENT NOTE/PROGRESS NOTE   Patient Name: Cheryl Bullock MRN: [id number] DOB:21-Dec-1940, 82 y.o., female Today's Date: 07/14/2022  Progress Note Reporting Period 06/17/22 to 07/14/22  See note below for Objective Data and Assessment of Progress/Goals.      END OF SESSION:  PT End of Session - 07/14/22 1057     Visit Number 9    Number of Visits 13    Date for PT Re-Evaluation 07/29/22    Authorization Type [employer]    Authorization Time Period 06/17/22 to 07/29/22    Authorization - Visit Number 9    Progress Note Due on Visit 19    PT Start Time 1019    PT Stop Time 1058    PT Time Calculation (min) 39 min    Activity Tolerance Patient tolerated treatment well;No increased pain    Behavior During Therapy [facility] for tasks assessed/performed               Past Medical History:  Diagnosis Date   Arthritis, hip    Complication of anesthesia    Elevated cholesterol    Endometriosis    Fibroid    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   TOTAL ABDOMINAL HYSTERECTOMY  01/20/1979   bilat oophorectomy   TOTAL HIP ARTHROPLASTY Left 05/08/2022   Procedure: TOTAL HIP ARTHROPLASTY ANTERIOR APPROACH;  Surgeon: Kathryne Hitch, MD;  Location: [facility];  Service: Orthopedics;  Laterality: Left;   Patient Active Problem List   Diagnosis Date Noted   Closed subcapital fracture of left femur (HCC) 05/08/2022   Closed left femoral fracture (HCC) 05/07/2022   Age-related osteoporosis without current pathological fracture 10/25/2021   Primary osteoarthritis of first carpometacarpal joint of left hand 03/25/2016   GERD (gastroesophageal reflux disease) 05/10/2014   Hyperlipidemia 05/10/2014   Hypothyroid 05/10/2014   Hx of  migraine headaches 05/10/2014    PCP: Guerry Bruin MD   REFERRING PROVIDER: Kathryne Hitch, MD  REFERRING DIAG: [phone] (ICD-10-CM) - Status post total replacement of left hip  THERAPY DIAG:  Muscle weakness (generalized)  Unsteadiness on feet  Rationale for Evaluation and Treatment: Rehabilitation  ONSET DATE: 05/08/22  SUBJECTIVE:   SUBJECTIVE STATEMENT:  Feeling better, tolerating more exercise at home. I'm still aware that the hip is "there" but its better. Having pain in R knee with hip hikes, is it ok to avoid that side?     PERTINENT HISTORY: HLD, global amnesia, HOH, osteoporosis, thyroid disease, THA 05/08/22  PAIN:  Are you having pain? No  0/10, soreness later in the day on active days  PRECAUTIONS: None direct anterior approach, HOH    WEIGHT BEARING RESTRICTIONS: No WBAT   FALLS:  Has patient fallen in last 6 months? Yes. Number of falls 1, (+) FOF   LIVING ENVIRONMENT: Lives with: lives alone Lives in: House/apartment Stairs: 7 STE in the front B, 4 STE in garage B rails; flight with rails inside  Has following equipment at home: Single point cane, Walker - 2 wheeled, and reacher, sock aide   OCCUPATION: retired   PLOF: Independent, Independent with basic ADLs, Independent with gait, and Independent with transfers  PATIENT GOALS: be more  confident in ability to maneuver safely, get some reassurance that this will not happen again/how to handle if it does happen again  NEXT MD VISIT: Dr. Magnus Ivan June 5th   OBJECTIVE: (objective measures completed at initial evaluation unless otherwise dated)   PATIENT SURVEYS:  FOTO 62, predicted 68 in 13 visits   COGNITION: Overall cognitive status: Within functional limits for tasks assessed     SENSATION: Some numbness along incision     LOWER EXTREMITY MMT:  MMT Right eval Left eval Right 07/02/22 Left 07/02/22 Right 07/14/22 Left 07/14/22   Hip flexion 4+ 4- 4+ 4+ 5 5  Hip extension 3 3  4 4  4+ 4  Hip abduction 3 3 4+ 4 3+ 5  Hip adduction        Hip internal rotation        Hip external rotation        Knee flexion 4+ 4+ 5 5 5 5   Knee extension 5 5 4 4 5 5   Ankle dorsiflexion     5 5  Ankle plantarflexion        Ankle inversion        Ankle eversion         (Blank rows = not tested)    FUNCTIONAL TESTS:  5 times sit to stand: 17.75 seconds no UEs but offloaded L LE, twisted at trunk to compensate; 07/02/22  14 seconds no UEs but still twisting at trunk/hips to compensate; 07/14/22- 14 seconds, less twisting noted   Dynamic Gait Index: 16/24; 6/25- 23/24  GAIT: Distance walked: in clinic distances  Assistive device utilized: None Level of assistance: Complete Independence Comments: proximal weakness noted, some favoring of L LE noted    TODAY'S TREATMENT:    DATE:   07/14/22  FOTO 49  Objective testing for MMT, DGI, 5x STS   Education on progress towards goals, POC/likely DC next visit, will have her ask if submersion in water is OK when she sees PA-C tomorrow/if so start water exercises, encouraged progression towards taking partial silver sneakers class and working up towards full class  TherEx  3 way hip with green TB around knees x5 B (limited by R hip pain) Attempted STS with green TB around knees- unable Bridges + ABD into green TB x10 Single leg bridges x10 B         [facility] Adult PT Treatment:                                                DATE: 07/09/22 Therapeutic Exercise: Fwd step ups in // bars 6 inch 2x8 LLE cues for velocity and pacing  4 inch lateral step ups in // bars 2x8 LL cues for step length and pacing  Significant time spent with education re: relevant anatomy/physiology and basic exercise principles to facilitate increased independence with HEP post discharge  Hip hike on 2 inch step 2x8 BIL cues for comfortable ROM GTB hip abduction 2x5 BIL superset with 2x5 hip ext cues for comfortable ROM HEP handout/education +  education    07/06/2022 Nu Step Level 6 for 5 minutes legs only Alternating hip hike 2 sets of 10 for 3 seconds Seated straight leg raises 3 sets of 5 for 3 seconds Seated marching with Thera-band Red 10 x 3 seconds Reviewed all exercises verbally and updated frequency to (mostly) 3 x  a week to avoid overuse as Hila has great HEP compliance  Neuromuscular re-education: Tandem balance: eyes open; head turning; eyes closed 2 x 20 seconds each Heel to toe raises with no hands 20 x 3 seconds   07/02/22  Updated objective measures- MMT and 5x sit to stand to assess progression of functional strength and general progress towards goals   TherEx  Nustep L5x6 minutes BLEs only 3 way hip red TB above  knees x10 B STS with red TB above knees x10 cue to not twist when standing up elevated mat table  Hip hikes x12 B Hip hikes + ABD x12 B  NMR  Tandem stance blue foam pad 3x30 seconds EO Lateral toe taps off side of blue foam pad x10 B Backwards steps off of blue foam pad x10 B   Selfcare  Education on productive pain (mm fatigue/ "mm burn") vs non-productive pain (radiating pain, numbness, stabbing, etc), progress in strength, compensations and ways to reduce them at home. Duration of exercises. Progressions of HEP and walking program.    [facility] Adult PT Treatment:                                                DATE: 06/29/22 Therapeutic Exercise: Nu step L5 BLE x 7 minutes (seat at 8) Supine bridge x 10 holding 5 sec,  x 10 c ball between knees x 10 holding 5 sec SLR: 2 x 10 Standing calf raises x 15 c UE support Standing hip extension: x 15 c UE support Standing hip abd x 15 c UE support Leg Press: 50# 3 x 10 bil LE's, single LE 25# x 10  Seated hamstring stretch x 3 each LE holding 20-30 sec Neuromuscular re-ed: Step ups x 15 c single UE support Lateral step ups x 15 c UE support Side stepping  20 feet x 2 each direction c close supervision                                                                                                                              PATIENT EDUCATION:  Education details: rationale for interventions, HEP  Person educated: Patient Education method: Explanation, Demonstration, and Handouts Education comprehension: verbalized understanding, returned demonstration, and needs further education  HOME EXERCISE PROGRAM: Access Code: [email or web address] URL: [email or web address] Date: 07/09/2022 Prepared by: Fransisco Hertz  Program Notes - only choose one version of hip hike per workout  Exercises - Clamshell with Resistance  - 1 x daily - 3-4 x weekly - 1 sets - 10 reps - 2 hold - Seated March with Resistance  - 2 x daily - 1 x weekly - 1 sets - 10 reps - 1 hold - Tandem Stance  - 1 x daily - 7 x weekly - 1 sets - 5 reps - 20 second hold -  Seated Hip Adduction Isometrics with Ball  - 2-3 x daily - 1 x weekly - 1 sets - 10 reps - Standing Hip Abduction with Counter Support  - 1 x daily - 3-4 x weekly - 10 reps - Standing Hip Extension with Counter Support  - 1 x daily - 3-4 x weekly - 1 sets - 10 reps - Heel Raises with Counter Support  - 1 x daily - 3-4 x weekly - 10 reps - Seated Hamstring Stretch  - 1 x daily - 7 x weekly - 3 reps - 20-30 seconds hold - Small Range Straight Leg Raise  - 1-2 x daily - 3-4 x weekly - 3-5 sets - 5 reps - 3 seconds hold - Standing Hip Hiking  - 3-5 x daily - 3-4 x weekly - 1 sets - 10 reps - 3 seconds hold - Hip Hiking on Step  - 1-2 x daily - 3-4 x weekly - 1 sets - 8 reps  ASSESSMENT:  CLINICAL IMPRESSION:  Tanicka arrives today doing well- per past notes, she seems to be doing well and may be ready for DC soon, also has upcoming MD appointment. Opted to update objective measures and POC today in light of all this- really making excellent progress towards goals and I agree that she is ready for DC (likely next visit. Will plan to update HEP thoroughly next visit.   OBJECTIVE IMPAIRMENTS: Abnormal  gait, decreased activity tolerance, decreased mobility, difficulty walking, and decreased strength.   ACTIVITY LIMITATIONS: squatting, stairs, and locomotion level  PARTICIPATION LIMITATIONS: cleaning, laundry, driving, shopping, community activity, yard work, and church  PERSONAL FACTORS: Age, Behavior pattern, Education, Fitness, Past/current experiences, and Time since onset of injury/illness/exacerbation are also affecting patient's functional outcome.   REHAB POTENTIAL: Excellent  CLINICAL DECISION MAKING: Stable/uncomplicated  EVALUATION COMPLEXITY: Low   GOALS: Goals reviewed with patient? Yes  SHORT TERM GOALS: Target date: 07/08/2022   Will be compliant with appropriate progressive HEP  Baseline: Goal status: Met 06/22/2022  2.  Will return to appropriate gym routine to assist in strength and activity tolerance gains  Baseline:  Goal status: On-going 07/14/2022   LONG TERM GOALS: Target date: 07/29/2022    MMT to improve by at least 1 grade in all weak groups  Baseline:  Goal status:  PARTIALLY MET 07/14/22  2.  Will score at least 20 on DGI to show improved balance/reduced fall risk  Baseline:  Goal status: MET 07/14/22  3.  Will be able to get down to/up from the floor safely on a Mod(I) basis to allow her to do regular exercise routine and garden  Baseline:  Goal status: MET 07/14/22  4.  FOTO score to improve to goal level by time of DC  Baseline:  Goal status: IN PROGRESS 07/14/22  5.  Will be able to complete 5x STS in 13 seconds or less with no compensations to show improved mobility and strength  Baseline:  Goal status: IN PROGRESS 07/14/22     PLAN:  PT FREQUENCY: 2x/week  PT DURATION: 6 weeks  PLANNED INTERVENTIONS: Therapeutic exercises, Therapeutic activity, Neuromuscular re-education, Gait training, Self Care, Manual therapy, and Re-evaluation  PLAN FOR NEXT SESSION: focus on HEP progression/updates, DC   Nedra Hai, PT, DPT 07/14/22  11:01 AM    Referring diagnosis? Z61.096 (ICD-10-CM) - Status post total replacement of left hip Treatment diagnosis? (if different than referring diagnosis)   Muscle weakness (generalized) M62.81  Unsteadiness on feet R26.81   What was  this (referring dx) caused by? [x]  Surgery [x]  Fall []  Ongoing issue []  Arthritis []  Other: ____________  Laterality: []  Rt [x]  Lt []  Both  Check all possible CPT codes:  *CHOOSE 10 OR LESS*    []  97110 (Therapeutic Exercise)  []  19147 (SLP Treatment)  []  97112 (Neuro Re-ed)   []  92526 (Swallowing Treatment)   []  97116 (Gait Training)   []  [id number] (Cognitive Training, 1st 15 minutes) []  97140 (Manual Therapy)   []  97130 (Cognitive Training, each add'l 15 minutes)  []  97164 (Re-evaluation)                              []  Other, List CPT Code ____________  []  97530 (Therapeutic Activities)     []  97535 (Self Care)   [x]  All codes above (97110 - 97535)  []  97012 (Mechanical Traction)  []  97014 (E-stim Unattended)  []  97032 (E-stim manual)  []  97033 (Ionto)  []  97035 (Ultrasound) []  97750 (Physical Performance Training) []  [id number] (Aquatic Therapy) []  97016 (Vasopneumatic Device) []  [id number] (Paraffin) []  97034 (Contrast Bath) []  97597 (Wound Care 1st 20 sq cm) []  97598 (Wound Care each add'l 20 sq cm) []  97760 (Orthotic Fabrication, Fitting, Training Initial) []  [id number] (Prosthetic Management and Training Initial) []  [id number] (Orthotic or Prosthetic Training/ Modification Subsequent)

## 2022-07-16 ENCOUNTER — Ambulatory Visit: Payer: Medicare PPO | Admitting: Physical Therapy

## 2022-07-16 ENCOUNTER — Encounter: Payer: Self-pay | Admitting: Physician Assistant

## 2022-07-16 ENCOUNTER — Ambulatory Visit (INDEPENDENT_AMBULATORY_CARE_PROVIDER_SITE_OTHER): Payer: Medicare PPO | Admitting: Physician Assistant

## 2022-07-16 ENCOUNTER — Encounter: Payer: Medicare PPO | Admitting: Physical Therapy

## 2022-07-16 ENCOUNTER — Ambulatory Visit (INDEPENDENT_AMBULATORY_CARE_PROVIDER_SITE_OTHER): Payer: Medicare PPO

## 2022-07-16 ENCOUNTER — Encounter: Payer: Self-pay | Admitting: Physical Therapy

## 2022-07-16 DIAGNOSIS — Z96642 Presence of left artificial hip joint: Secondary | ICD-10-CM | POA: Diagnosis not present

## 2022-07-16 DIAGNOSIS — R2681 Unsteadiness on feet: Secondary | ICD-10-CM | POA: Diagnosis not present

## 2022-07-16 DIAGNOSIS — M6281 Muscle weakness (generalized): Secondary | ICD-10-CM

## 2022-07-16 NOTE — Progress Notes (Signed)
HPI: Cheryl Bullock returns today status post left total hip arthroplasty 05/08/2022.  She states she is doing well.  She still has some puffiness around the lateral aspect of the hip she states.  Overall she feels therapy is helping with her range of motion and strength.  She is not really having no pain in the hip at this point in time.  Physical exam: General well-developed well-nourished female no acute distress uses no assistive device to ambulate. Left hip: Full range of motion without pain.  Nontender over the trochanteric region.  Left calf supple nontender dorsiflexion plantarflexion ankle intact.   Radiographs: AP pelvis lateral view of the left hip: Hips well located.  No acute findings.  Implants appear well-seated.  Distal stem appears to have some bony ingrowth.   Impression status post left total hip arthroplasty  Plan: She will work on overall strengthening balance.  Discussed with her IT band stretching.  Like for her to work abduction exercises hips.  Questions were encouraged and answered at length.  Will see her back at her 30-month follow-up.  AP standing pelvis at that time.

## 2022-07-16 NOTE — Therapy (Signed)
OUTPATIENT PHYSICAL THERAPY TREATMENT NOTE DISCHARGE SUMMARY   Patient Name: Cheryl Bullock MRN: [id number] DOB:1940/01/31, 82 y.o., female Today's Date: 07/16/2022     END OF SESSION:  PT End of Session - 07/16/22 1051     Visit Number 10    Number of Visits 13    Date for PT Re-Evaluation 07/29/22    Authorization Type [employer] MCR    Authorization Time Period 06/17/22 to 07/29/22    Progress Note Due on Visit 19    PT Start Time 1051    PT Stop Time 1130    PT Time Calculation (min) 39 min    Activity Tolerance Patient tolerated treatment well;No increased pain    Behavior During Therapy [facility] for tasks assessed/performed                Past Medical History:  Diagnosis Date   Arthritis, hip    Complication of anesthesia    Elevated cholesterol    Endometriosis    Fibroid    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   TOTAL ABDOMINAL HYSTERECTOMY  01/20/1979   bilat oophorectomy   TOTAL HIP ARTHROPLASTY Left 05/08/2022   Procedure: TOTAL HIP ARTHROPLASTY ANTERIOR APPROACH;  Surgeon: Kathryne Hitch, MD;  Location: [facility];  Service: Orthopedics;  Laterality: Left;   Patient Active Problem List   Diagnosis Date Noted   Closed subcapital fracture of left femur (HCC) 05/08/2022   Closed left femoral fracture (HCC) 05/07/2022   Age-related osteoporosis without current pathological fracture 10/25/2021   Primary osteoarthritis of first carpometacarpal joint of left hand 03/25/2016   GERD (gastroesophageal reflux disease) 05/10/2014   Hyperlipidemia 05/10/2014   Hypothyroid 05/10/2014   Hx of migraine headaches 05/10/2014    PCP: Guerry Bruin MD   REFERRING PROVIDER: Kathryne Hitch, MD  REFERRING DIAG: [phone] (ICD-10-CM) -  Status post total replacement of left hip  THERAPY DIAG:  Muscle weakness (generalized)  Unsteadiness on feet  Rationale for Evaluation and Treatment: Rehabilitation  ONSET DATE: 05/08/22  SUBJECTIVE:   SUBJECTIVE STATEMENT: No complaints today  PERTINENT HISTORY: HLD, global amnesia, HOH, osteoporosis, thyroid disease, THA 05/08/22  PAIN:  Are you having pain? No  0/10, soreness later in the day on active days  PRECAUTIONS: None direct anterior approach, HOH    WEIGHT BEARING RESTRICTIONS: No WBAT   FALLS:  Has patient fallen in last 6 months? Yes. Number of falls 1, (+) FOF   LIVING ENVIRONMENT: Lives with: lives alone Lives in: House/apartment Stairs: 7 STE in the front B, 4 STE in garage B rails; flight with rails inside  Has following equipment at home: Single point cane, Walker - 2 wheeled, and reacher, sock aide   OCCUPATION: retired   PLOF: Independent, Independent with basic ADLs, Independent with gait, and Independent with transfers  PATIENT GOALS: be more confident in ability to maneuver safely, get some reassurance that this will not happen again/how to handle if it does happen again  NEXT MD VISIT: Dr. Magnus Ivan June 5th   OBJECTIVE: (objective measures completed at initial evaluation unless otherwise dated)   PATIENT SURVEYS:  EVAL: FOTO 62, predicted 68 in 13 visits  07/14/22: FOTO 49  COGNITION: Overall cognitive status: Within functional limits for tasks assessed     SENSATION: Some numbness along incision     LOWER EXTREMITY MMT:  MMT Right eval Left eval Right 07/02/22 Left 07/02/22 Right 07/14/22 Left 07/14/22   Hip flexion 4+ 4- 4+ 4+ 5 5  Hip extension 3 3 4 4  4+ 4  Hip abduction 3 3 4+ 4 3+ 5  Knee flexion 4+ 4+ 5 5 5 5   Knee extension 5 5 4 4 5 5   Ankle dorsiflexion     5 5   (Blank rows = not tested)    FUNCTIONAL TESTS:  5 times sit to stand: 17.75 seconds no UEs but offloaded L LE, twisted at trunk to compensate; 07/02/22  14  seconds no UEs but still twisting at trunk/hips to compensate; 07/14/22- 14 seconds, less twisting noted ; 07/16/22:   Dynamic Gait Index: 16/24; 6/25- 23/24  GAIT: Distance walked: in clinic distances  Assistive device utilized: None Level of assistance: Complete Independence Comments: proximal weakness noted, some favoring of L LE noted    TODAY'S TREATMENT:    DATE:  07/16/22 TherEx NuStep L5 x 5 min Review of HEP with pt performing 1 set of each exercise  Discussed return to water aerobics and silver sneakers classes and how to gradually progress into full class  07/14/22 Objective testing for MMT, DGI, 5x STS   Education on progress towards goals, POC/likely DC next visit, will have her ask if submersion in water is OK when she sees PA-C tomorrow/if so start water exercises, encouraged progression towards taking partial silver sneakers class and working up towards full class  TherEx  3 way hip with green TB around knees x5 B (limited by R hip pain) Attempted STS with green TB around knees- unable Bridges + ABD into green TB x10 Single leg bridges x10 B   07/09/22 Therapeutic Exercise: Fwd step ups in // bars 6 inch 2x8 LLE cues for velocity and pacing  4 inch lateral step ups in // bars 2x8 LL cues for step length and pacing  Significant time spent with education re: relevant anatomy/physiology and basic exercise principles to facilitate increased independence with HEP post discharge  Hip hike on 2 inch step 2x8 BIL cues for comfortable ROM GTB hip abduction 2x5 BIL superset with 2x5 hip ext cues for comfortable ROM HEP handout/education + education    07/06/2022 Nu Step Level 6 for 5 minutes legs only Alternating hip hike 2 sets of 10 for 3 seconds Seated straight leg raises 3 sets of 5 for 3 seconds Seated marching with Thera-band Red 10 x 3 seconds Reviewed all exercises verbally and updated frequency to (mostly) 3 x a week to avoid overuse as Daysi has great HEP  compliance  Neuromuscular re-education: Tandem balance: eyes open; head turning; eyes closed 2 x 20 seconds each Heel to toe raises with no hands 20 x 3 seconds  PATIENT EDUCATION:  Education details: rationale for interventions, HEP  Person educated: Patient Education method: Explanation, Demonstration, and Handouts Education comprehension: verbalized understanding, returned demonstration, and needs further education  HOME EXERCISE PROGRAM: Access Code: [email or web address] URL: [email or web address] Date: 07/09/2022 Prepared by: Fransisco Hertz  Program Notes - only choose one version of hip hike per workout  Exercises - Clamshell with Resistance  - 1 x daily - 3-4 x weekly - 1 sets - 10 reps - 2 hold - Seated March with Resistance  - 2 x daily - 1 x weekly - 1 sets - 10 reps - 1 hold - Tandem Stance  - 1 x daily - 7 x weekly - 1 sets - 5 reps - 20 second hold - Seated Hip Adduction Isometrics with Ball  - 2-3 x daily - 1 x weekly - 1 sets - 10 reps - Standing Hip Abduction with Counter Support  - 1 x daily - 3-4 x weekly - 10 reps - Standing Hip Extension with Counter Support  - 1 x daily - 3-4 x weekly - 1 sets - 10 reps - Heel Raises with Counter Support  - 1 x daily - 3-4 x weekly - 10 reps - Seated Hamstring Stretch  - 1 x daily - 7 x weekly - 3 reps - 20-30 seconds hold - Small Range Straight Leg Raise  - 1-2 x daily - 3-4 x weekly - 3-5 sets - 5 reps - 3 seconds hold - Standing Hip Hiking  - 3-5 x daily - 3-4 x weekly - 1 sets - 10 reps - 3 seconds hold - Hip Hiking on Step  - 1-2 x daily - 3-4 x weekly - 1 sets - 8 reps  ASSESSMENT:  CLINICAL IMPRESSION: Pt has met all LTGs except FOTO which initial responses may have been inaccurate.  She improved on 5x STS but did not quite meet goal.  Overall she is doing excellent and is ready for d/c from PT  with transition to HEP and gym program.  Will d/c PT today  OBJECTIVE IMPAIRMENTS: Abnormal gait, decreased activity tolerance, decreased mobility, difficulty walking, and decreased strength.   ACTIVITY LIMITATIONS: squatting, stairs, and locomotion level  PARTICIPATION LIMITATIONS: cleaning, laundry, driving, shopping, community activity, yard work, and church  PERSONAL FACTORS: Age, Behavior pattern, Education, Fitness, Past/current experiences, and Time since onset of injury/illness/exacerbation are also affecting patient's functional outcome.   REHAB POTENTIAL: Excellent  CLINICAL DECISION MAKING: Stable/uncomplicated  EVALUATION COMPLEXITY: Low   GOALS: Goals reviewed with patient? Yes  SHORT TERM GOALS: Target date: 07/08/2022   Will be compliant with appropriate progressive HEP  Baseline: Goal status: Met 06/22/2022  2.  Will return to appropriate gym routine to assist in strength and activity tolerance gains  Baseline:  Goal status: MET 07/14/2022   LONG TERM GOALS: Target date: 07/29/2022    MMT to improve by at least 1 grade in all weak groups  Baseline:  Goal status:  MET 07/16/22  (07/14/22 note)  2.  Will score at least 20 on DGI to show improved balance/reduced fall risk  Baseline:  Goal status: MET 07/14/22  3.  Will be able to get down to/up from the floor safely on a Mod(I) basis to allow her to do regular exercise routine and garden  Baseline:  Goal status: MET 07/14/22  4.  FOTO score to improve to goal level by time of DC  Baseline:  Goal status: NOT MET 07/14/22 (survey inaccurate)  5.  Will be  able to complete 5x STS in 13 seconds or less with no compensations to show improved mobility and strength  Baseline:  Goal status: Partially MET 07/14/22 (improved to 14 sec; nearly met)     PLAN:  PT FREQUENCY: 2x/week  PT DURATION: 6 weeks  PLANNED INTERVENTIONS: Therapeutic exercises, Therapeutic activity, Neuromuscular re-education, Gait training,  Self Care, Manual therapy, and Re-evaluation  PLAN FOR NEXT SESSION: focus on HEP progression/updates, DC   Clarita Crane, PT, DPT 07/16/22 11:39 AM   PHYSICAL THERAPY DISCHARGE SUMMARY  Visits from Start of Care: 10  Current functional level related to goals / functional outcomes: See above   Remaining deficits: See above   Education / Equipment: HEP   Patient agrees to discharge. Patient goals were partially met. Patient is being discharged due to being pleased with the current functional level.  Clarita Crane, PT, DPT 07/16/22 11:39 AM  [facility] [address] [place], [place], [address] Phone: [phone]   Fax:  [phone]

## 2022-07-28 ENCOUNTER — Encounter: Payer: Self-pay | Admitting: Obstetrics and Gynecology

## 2022-07-29 NOTE — Telephone Encounter (Signed)
Office notes from [facility] available in care everywhere.

## 2022-07-31 NOTE — Telephone Encounter (Signed)
Will route to provider for final review and close.  

## 2022-08-05 ENCOUNTER — Ambulatory Visit: Payer: Medicare PPO | Admitting: Orthopaedic Surgery

## 2022-08-13 ENCOUNTER — Other Ambulatory Visit: Payer: Self-pay | Admitting: Obstetrics and Gynecology

## 2022-08-13 DIAGNOSIS — Z1231 Encounter for screening mammogram for malignant neoplasm of breast: Secondary | ICD-10-CM

## 2022-09-01 DIAGNOSIS — Z974 Presence of external hearing-aid: Secondary | ICD-10-CM | POA: Diagnosis not present

## 2022-09-01 DIAGNOSIS — H903 Sensorineural hearing loss, bilateral: Secondary | ICD-10-CM | POA: Diagnosis not present

## 2022-09-17 DIAGNOSIS — D692 Other nonthrombocytopenic purpura: Secondary | ICD-10-CM | POA: Diagnosis not present

## 2022-09-17 DIAGNOSIS — L570 Actinic keratosis: Secondary | ICD-10-CM | POA: Diagnosis not present

## 2022-09-17 DIAGNOSIS — L821 Other seborrheic keratosis: Secondary | ICD-10-CM | POA: Diagnosis not present

## 2022-09-17 DIAGNOSIS — D2272 Melanocytic nevi of left lower limb, including hip: Secondary | ICD-10-CM | POA: Diagnosis not present

## 2022-09-17 DIAGNOSIS — L820 Inflamed seborrheic keratosis: Secondary | ICD-10-CM | POA: Diagnosis not present

## 2022-09-17 DIAGNOSIS — I8392 Asymptomatic varicose veins of left lower extremity: Secondary | ICD-10-CM | POA: Diagnosis not present

## 2022-09-17 DIAGNOSIS — I8391 Asymptomatic varicose veins of right lower extremity: Secondary | ICD-10-CM | POA: Diagnosis not present

## 2022-09-29 ENCOUNTER — Ambulatory Visit
Admission: RE | Admit: 2022-09-29 | Discharge: 2022-09-29 | Disposition: A | Discharge status: Home / Self Care | Payer: Medicare PPO | Source: Ambulatory Visit | Attending: Obstetrics and Gynecology | Admitting: Obstetrics and Gynecology

## 2022-09-29 DIAGNOSIS — Z1231 Encounter for screening mammogram for malignant neoplasm of breast: Secondary | ICD-10-CM

## 2022-10-29 ENCOUNTER — Telehealth: Payer: Self-pay

## 2022-10-29 ENCOUNTER — Encounter: Payer: Self-pay | Admitting: Radiology

## 2022-10-29 NOTE — Telephone Encounter (Signed)
Dr. Aundra Dubin dentist office would like pre medication note/letter faxed to [phone].  CB# [phone].  Please advise.  Thank you.

## 2022-10-29 NOTE — Telephone Encounter (Signed)
Faxed

## 2022-10-30 DIAGNOSIS — Z1389 Encounter for screening for other disorder: Secondary | ICD-10-CM | POA: Diagnosis not present

## 2022-10-30 DIAGNOSIS — E781 Pure hyperglyceridemia: Secondary | ICD-10-CM | POA: Diagnosis not present

## 2022-10-30 DIAGNOSIS — E039 Hypothyroidism, unspecified: Secondary | ICD-10-CM | POA: Diagnosis not present

## 2022-10-30 DIAGNOSIS — M8580 Other specified disorders of bone density and structure, unspecified site: Secondary | ICD-10-CM | POA: Diagnosis not present

## 2022-11-06 DIAGNOSIS — G43009 Migraine without aura, not intractable, without status migrainosus: Secondary | ICD-10-CM | POA: Diagnosis not present

## 2022-11-06 DIAGNOSIS — E039 Hypothyroidism, unspecified: Secondary | ICD-10-CM | POA: Diagnosis not present

## 2022-11-06 DIAGNOSIS — E7800 Pure hypercholesterolemia, unspecified: Secondary | ICD-10-CM | POA: Diagnosis not present

## 2022-11-06 DIAGNOSIS — R82998 Other abnormal findings in urine: Secondary | ICD-10-CM | POA: Diagnosis not present

## 2022-11-06 DIAGNOSIS — Z1339 Encounter for screening examination for other mental health and behavioral disorders: Secondary | ICD-10-CM | POA: Diagnosis not present

## 2022-11-06 DIAGNOSIS — M8580 Other specified disorders of bone density and structure, unspecified site: Secondary | ICD-10-CM | POA: Diagnosis not present

## 2022-11-06 DIAGNOSIS — Z1331 Encounter for screening for depression: Secondary | ICD-10-CM | POA: Diagnosis not present

## 2022-11-06 DIAGNOSIS — Z0000 Encounter for general adult medical examination without abnormal findings: Secondary | ICD-10-CM | POA: Diagnosis not present

## 2022-11-06 DIAGNOSIS — Z23 Encounter for immunization: Secondary | ICD-10-CM | POA: Diagnosis not present

## 2022-11-09 ENCOUNTER — Ambulatory Visit (INDEPENDENT_AMBULATORY_CARE_PROVIDER_SITE_OTHER): Payer: Medicare PPO | Admitting: Physician Assistant

## 2022-11-09 ENCOUNTER — Other Ambulatory Visit (INDEPENDENT_AMBULATORY_CARE_PROVIDER_SITE_OTHER): Payer: Self-pay

## 2022-11-09 ENCOUNTER — Ambulatory Visit: Payer: Medicare PPO | Admitting: Physician Assistant

## 2022-11-09 ENCOUNTER — Ambulatory Visit: Payer: Medicare PPO | Admitting: Orthopaedic Surgery

## 2022-11-09 DIAGNOSIS — M6281 Muscle weakness (generalized): Secondary | ICD-10-CM | POA: Diagnosis not present

## 2022-11-09 DIAGNOSIS — Z96642 Presence of left artificial hip joint: Secondary | ICD-10-CM

## 2022-11-09 DIAGNOSIS — Z7409 Other reduced mobility: Secondary | ICD-10-CM

## 2022-11-09 NOTE — Addendum Note (Signed)
Addended by: Mardene Celeste B on: 11/09/2022 12:40 PM   Modules accepted: Orders

## 2022-11-09 NOTE — Progress Notes (Signed)
HPI: Cheryl Bullock returns today status post left total hip arthroplasty 05/08/2022.  This is total hip replacement performed secondary to a subcapital left hip fracture.  She overall is doing well.  She is working out with Geophysicist/field seismologist doing water aerobics and walking for therapy.  She is questioning if she needs to go to therapy she would like to be a little more challenged.  She does note that she has problems getting up and down out of a seated position due to weakness in her legs.  She also feels that her gait and balance remain off.  She did go to formal therapy and was shown some exercises but she feels like she is advanced beyond these at this point in time.  Some tenderness about the left hip but overall doing well.  Review of systems: See HPI otherwise negative  Physical exam: General: Well-developed well-nourished female who ambulates without any assistive device or antalgic gait. Bilateral hands: Good range of motion of both hips without pain. Bilateral knees: Full extension full flexion.  Quad atrophy bilaterally.  No instability of either knee.  Radiographs: AP pelvis: Status post left total hip arthroplasty well-seated components.  No acute fractures no acute findings.  Right hip overall well-maintained.  Impression: Status post left total hip arthroplasty Gait/balance disturbance. Quad weakness  Plan: Will send her to formal physical therapy to work on quad strengthening and gait and balance.  She will continue her Silver sneakers exercises, water aerobics and walking for exercise.  Hopefully she will gain from therapy more challenging home exercise program.  Will see her back at 1 year postop.  Sooner if there is any questions or concerns.

## 2022-11-25 ENCOUNTER — Encounter: Payer: Self-pay | Admitting: Physical Therapy

## 2022-11-25 ENCOUNTER — Ambulatory Visit: Payer: Medicare PPO | Admitting: Physical Therapy

## 2022-11-25 ENCOUNTER — Other Ambulatory Visit: Payer: Self-pay

## 2022-11-25 DIAGNOSIS — M6281 Muscle weakness (generalized): Secondary | ICD-10-CM

## 2022-11-25 DIAGNOSIS — R2689 Other abnormalities of gait and mobility: Secondary | ICD-10-CM

## 2022-11-25 DIAGNOSIS — M25552 Pain in left hip: Secondary | ICD-10-CM | POA: Diagnosis not present

## 2022-11-25 NOTE — Therapy (Signed)
OUTPATIENT PHYSICAL THERAPY LOWER EXTREMITY EVALUATION   Patient Name: Cheryl Bullock MRN: [id number] DOB:09-11-1940, 82 y.o., female Today's Date: 11/25/2022  END OF SESSION:  PT End of Session - 11/25/22 1124     Visit Number 1    Number of Visits 6    Date for PT Re-Evaluation 02/03/23    Authorization Type [employer]    PT Start Time 1015    PT Stop Time 1105    PT Time Calculation (min) 50 min    Activity Tolerance Patient tolerated treatment well             Past Medical History:  Diagnosis Date   Arthritis, hip    Complication of anesthesia    Elevated cholesterol    Endometriosis    Fibroid    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   TOTAL ABDOMINAL HYSTERECTOMY  01/20/1979   bilat oophorectomy   TOTAL HIP ARTHROPLASTY Left 05/08/2022   Procedure: TOTAL HIP ARTHROPLASTY ANTERIOR APPROACH;  Surgeon: Kathryne Hitch, MD;  Location: [facility];  Service: Orthopedics;  Laterality: Left;   Patient Active Problem List   Diagnosis Date Noted   Closed subcapital fracture of left femur (HCC) 05/08/2022   Closed left femoral fracture (HCC) 05/07/2022   Age-related osteoporosis without current pathological fracture 10/25/2021   Primary osteoarthritis of first carpometacarpal joint of left hand 03/25/2016   GERD (gastroesophageal reflux disease) 05/10/2014   Hyperlipidemia 05/10/2014   Hypothyroid 05/10/2014   Hx of migraine headaches 05/10/2014    PCP: Gaspar Garbe, MD   REFERRING PROVIDER: Kirtland Bouchard, PA-C   REFERRING DIAG:  [phone] (ICD-10-CM) - Status post total replacement of left hip  M62.81 (ICD-10-CM) - Quadriceps weakness  Z74.09 (ICD-10-CM) - Impaired functional mobility, balance, gait, and endurance    THERAPY DIAG:   Pain in left hip  Muscle weakness (generalized)  Other abnormalities of gait and mobility  Rationale for Evaluation and Treatment: Rehabilitation  ONSET DATE: status post left total hip arthroplasty 05/08/2022.   SUBJECTIVE:   SUBJECTIVE STATEMENT: Status post left total hip arthroplasty 05/08/2022. This is total hip replacement performed secondary to a subcapital left hip fracture. She does note that she has problems getting up and down out of a seated position due to weakness in her legs. She also feels that her gait and balance remain off. She had initial HEP from PT after THA but feels she needs progressions.    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 Yes. Number of falls 1 when she slipped on wet brick, denies any injury from this  PLOF: Independent  PATIENT GOALS: learning more challenging exercises  NEXT MD VISIT: 05/10/23  OBJECTIVE:  Note: Objective measures were completed at Evaluation unless otherwise noted.  DIAGNOSTIC FINDINGS: AP pelvis: Status post left total hip arthroplasty well-seated components.   No acute fractures no acute findings.  Right hip overall well-maintained.  PATIENT SURVEYS:  Eval FOTO 54% functional, predicted goal is to maintain 52%  COGNITION: Overall cognitive status: Within functional limits for tasks assessed     SENSATION: WFL   MUSCLE LENGTH: Mild tightness in  Lt Hamstrings, glutes, ITB   POSTURE: No Significant postural limitations   LOWER EXTREMITY ROM: WFL grossly but hip ER ROM limited by 25%   LOWER EXTREMITY MMT:  MMT Right eval Left eval  Hip flexion 4+ 4  Hip extension     Hip abduction 4+ 4  Hip adduction    Hip internal rotation 5 4  Hip external rotation 5 5  Knee flexion 5 5  Knee extension 4 5  Ankle dorsiflexion    Ankle plantarflexion    Ankle inversion    Ankle eversion     (Blank rows = not tested)  FUNCTIONAL TESTS:  SLS 20 seconds bilat   TODAY'S TREATMENT:  Eval HEP creation and review with demonstration and trial set preformed, see below for details    PATIENT EDUCATION: Education details: HEP, PT plan of care Person educated: Patient Education method: Explanation, Demonstration, Verbal cues, and Handouts Education comprehension: verbalized understanding and needs further education   HOME EXERCISE PROGRAM: Access Code: [email or web address] URL: [email or web address] Date: 11/25/2022 Prepared by: Ivery Quale  Exercises - Supine ITB Stretch with Strap (Mirrored)  - 1 x daily - 6 x weekly - 3 reps - 30 hold - Seated Piriformis Stretch with Trunk Bend  - 1 x daily - 6 x weekly - 1 sets - 3 reps - 30 hold - Hip Hiking on Step (Mirrored)  - 1 x daily - 6 x weekly - 2 sets - 10 reps - Single Leg Stance  - 1 x daily - 6 x weekly - 1 sets - 3 reps - 20 sec hold - Sit to Stand Without Arm Support  - 1 x daily - 6 x weekly - 3 sets - 5 reps - slow lower hold - Sidestepping in Squat with Resistance and Arms Forward  - 1 x daily - 6 x weekly - 2-3 sets - 10 reps - Band Walks  - 1 x daily - 6 x weekly - 2-3 sets - 10 reps - Wall Squat  - 1 x daily - 6 x weekly - 1 sets - 10 reps - 10 seconds hold  ASSESSMENT:  CLINICAL IMPRESSION: Patient referred to PT Status post left total hip arthroplasty 05/08/2022 and still with some weakness and tightness in her hip limiting function such as with sit to stand and gait. Patient will benefit from skilled PT to address below impairments, limitations and improve overall function.  OBJECTIVE IMPAIRMENTS: decreased activity tolerance, difficulty walking, decreased balance, decreased mobility,  decreased ROM, decreased strength, impaired flexibility, impaired LE use, and pain.  ACTIVITY LIMITATIONS: bending, lifting, carry, locomotion, cleaning, community activity,  PERSONAL FACTORS: see above PMH are also affecting patient's functional outcome.  REHAB POTENTIAL: Good  CLINICAL DECISION MAKING: Stable/uncomplicated  EVALUATION COMPLEXITY: Low    GOALS: Short term PT Goals Target date: 12/23/2022   Pt will be I and compliant with HEP. Baseline:  Goal status: New  Long term PT goals Target date:02/03/2023   Pt will improve Lt hip ER ROM to WNL to improve functional mobility Baseline: Goal status: New Pt will improve  hip/knee strength to at least 4+/5 MMT to improve functional strength Baseline: Goal status: New Pt will maintain 52% predicted functional score to Baseline:54% Goal status: New  PLAN: PT FREQUENCY: 1  times per 1-2 weeks  PT DURATION: 6-10 weeks  PLANNED INTERVENTIONS (unless contraindicated): aquatic PT, Canalith repositioning, cryotherapy, Electrical stimulation, Iontophoresis with 4 mg/ml dexamethasome, Moist heat, traction, Ultrasound, gait training, Therapeutic exercise, balance training, neuromuscular re-education, patient/family education, prosthetic training, manual techniques, passive ROM, dry needling, taping, vasopnuematic device, vestibular, spinal manipulations, joint manipulations 97110-Therapeutic exercises, 97530- Therapeutic activity, [id number]- Neuromuscular re-education, 97535- Self Care, and 09811- Manual therapy  PLAN FOR NEXT SESSION: try aquatic PT, review and progress HEP PRN  Ivery Quale, PT, DPT 11/25/22 12:37 PM  Referring diagnosis?  B14.782 (ICD-10-CM) - Status post total replacement of left hip  M62.81 (ICD-10-CM) - Quadriceps weakness  Z74.09 (ICD-10-CM) - Impaired functional mobility, balance, gait, and endurance   Treatment diagnosis? (if different than referring diagnosis)  What was this (referring dx) caused  by? [x]  Surgery []  Fall []  Ongoing issue []  Arthritis []  Other: ____________  Laterality: []  Rt [x]  Lt []  Both  Check all possible CPT codes:97110-Therapeutic exercises, 97530- Therapeutic activity, [id number]- Neuromuscular re-education, 97535- Self Care, and 95621- Manual therapy, 97113 aquatic PT *CHOOSE 10 OR LESS*

## 2022-12-11 ENCOUNTER — Ambulatory Visit (INDEPENDENT_AMBULATORY_CARE_PROVIDER_SITE_OTHER): Payer: Medicare PPO | Admitting: Physical Therapy

## 2022-12-11 ENCOUNTER — Encounter: Payer: Self-pay | Admitting: Physical Therapy

## 2022-12-11 DIAGNOSIS — M6281 Muscle weakness (generalized): Secondary | ICD-10-CM

## 2022-12-11 DIAGNOSIS — M25552 Pain in left hip: Secondary | ICD-10-CM

## 2022-12-11 DIAGNOSIS — R2689 Other abnormalities of gait and mobility: Secondary | ICD-10-CM

## 2022-12-11 DIAGNOSIS — R2681 Unsteadiness on feet: Secondary | ICD-10-CM

## 2022-12-11 NOTE — Therapy (Signed)
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   TOTAL ABDOMINAL HYSTERECTOMY  01/20/1979   bilat oophorectomy   TOTAL HIP ARTHROPLASTY Left 05/08/2022   Procedure: TOTAL HIP ARTHROPLASTY ANTERIOR APPROACH;  Surgeon: Kathryne Hitch, MD;  Location: [facility];  Service: Orthopedics;  Laterality: Left;   Patient Active Problem List   Diagnosis Date Noted   Closed subcapital fracture of left femur (HCC) 05/08/2022   Closed left femoral fracture (HCC) 05/07/2022   Age-related osteoporosis without current pathological fracture 10/25/2021   Primary osteoarthritis of first carpometacarpal joint of left hand 03/25/2016   GERD (gastroesophageal reflux disease) 05/10/2014   Hyperlipidemia 05/10/2014   Hypothyroid 05/10/2014   Hx of migraine headaches 05/10/2014    PCP: Gaspar Garbe, MD   REFERRING PROVIDER: Kirtland Bouchard, PA-C   REFERRING DIAG:  [phone] (ICD-10-CM) - Status post total replacement of left hip  M62.81 (ICD-10-CM) - Quadriceps weakness  Z74.09 (ICD-10-CM) - Impaired functional mobility, balance, gait, and endurance    THERAPY DIAG:  Pain in left  hip  Muscle weakness (generalized)  Other abnormalities of gait and mobility  Unsteadiness on feet  Rationale for Evaluation and Treatment: Rehabilitation  ONSET DATE: status post left total hip arthroplasty 05/08/2022.   SUBJECTIVE:   SUBJECTIVE STATEMENT: Relays she has been doing all the exercises but wall sits as that one bothers her knee. She does feel she can get up from a chair better now.  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 Yes. Number of falls 1 when she slipped on wet brick, denies any injury from this  PLOF: Independent  PATIENT GOALS: learning more challenging exercises  NEXT MD VISIT: 05/10/23  OBJECTIVE:  Note: Objective measures were completed at Evaluation unless otherwise noted.  DIAGNOSTIC FINDINGS: AP pelvis: Status post left total hip arthroplasty well-seated components.   No acute fractures no acute findings.  Right hip overall well-maintained.   PATIENT SURVEYS:  Eval FOTO 54% functional, predicted goal is to maintain 52%  COGNITION: Overall cognitive status: Within functional limits for tasks assessed     SENSATION: WFL   MUSCLE LENGTH: Mild  tightness in  Lt Hamstrings, glutes, ITB   POSTURE: No Significant postural limitations   LOWER EXTREMITY ROM: WFL grossly but hip ER ROM limited by 25%   LOWER EXTREMITY MMT:  MMT Right eval Left eval  Hip flexion 4+ 4  Hip extension    Hip abduction 4+ 4  Hip adduction    Hip internal rotation 5 4  Hip external rotation 5 5  Knee flexion 5 5  Knee extension 4 5  Ankle dorsiflexion    Ankle plantarflexion    Ankle inversion    Ankle eversion      (Blank rows = not tested)  FUNCTIONAL TESTS:  SLS 20 seconds bilat   TODAY'S TREATMENT:  DATE: 12/11/22 Pt seen for aquatic therapy today.  Treatment took place in water 3.5-4.75 ft in depth at the [facility] pool. Temp of water was 91.  Pt entered/exited the pool via stairs with hand rail.   Pt requires the buoyancy and hydrostatic pressure of water for support, and to offload joints by unweighting joint load by at least 50 % in navel deep water and by at least 75-80% in chest to neck deep water.  Viscosity of the water is needed for resistance of strengthening. Water current perturbations provides challenge to standing balance requiring increased core activation. Aquatic PT exercises Forward walking 3 round trips horizontal width of pool Backward walking 3 round trips Sidestepping 3 round trips Sidestepping with hop length of pool 3 round trips Front/back jogging 3 round trips in pool Tandem walk 3 round trips March walking 3 round trips, progressed to light jog Leg swings hip abd/add X 20 bilat with UE support with UE support Leg swings hip flexion/extension X 20 bilat Hops DL in/out X 20 Scissor hops (needed deeper water) X 20 bilat Hip circles CW and CCW X 15 each bilat Lunge step to pool wall, touch wall, then step back, X 15 bilat Monster walk hops 3 round trips Lunge stretch at pool 5 sec X 10  Squats at first step of pool holding onto rails X 20 Seated on pool bench cycling, and leg swings X 5 min   PATIENT EDUCATION: Education details: HEP, PT plan of care Person educated: Patient Education method: Explanation, Demonstration, Verbal cues, and Handouts Education comprehension: verbalized understanding and needs further education   HOME EXERCISE PROGRAM: Access Code: [email or web address] URL: [email or web address] Date: 11/25/2022 Prepared by: Ivery Quale  Exercises - Supine ITB Stretch with Strap (Mirrored)  - 1 x daily - 6 x weekly - 3 reps - 30  hold - Seated Piriformis Stretch with Trunk Bend  - 1 x daily - 6 x weekly - 1 sets - 3 reps - 30 hold - Hip Hiking on Step (Mirrored)  - 1 x daily - 6 x weekly - 2 sets - 10 reps - Single Leg Stance  - 1 x daily - 6 x weekly - 1 sets - 3 reps - 20 sec hold - Sit to Stand Without Arm Support  - 1 x daily - 6 x weekly - 3 sets - 5 reps - slow lower hold - Sidestepping in Squat with Resistance and Arms Forward  - 1 x daily - 6 x weekly - 2-3 sets - 10 reps - Band Walks  - 1 x daily - 6 x weekly - 2-3 sets - 10 reps - Wall Squat  - 1 x daily - 6 x weekly - 1 sets - 10 reps - 10 seconds hold  Aquatic HEP  Access Code: [email or web address] URL: [email or web address] Date: 12/11/2022 Prepared by: Ivery Quale  Exercises - Forward Walking  - 1 x daily - 2 x weekly - 4 sets - Side Stepping  - 2 x daily - 2 x weekly - 4 sets - 10 reps - Standing Hip Flexion Extension at [employer]  - 1 x daily - 2 x weekly - 15 reps - Standing Hip Abduction Adduction at Pool Wall  - 1 x daily - 2 x weekly - 20 reps - Squat  - 1 x daily - 2 x weekly - 15 reps - Lunge to Target at [employer]  - 1 x daily - 2 x weekly - 1 sets - 20 reps - Alternating Arms Jumping in Place  - 1 x daily - 2 x weekly - 2 sets - 10 reps - lateral hops in water depth you prefer  - 2 x daily - 6 x weekly - 3 sets - 10 reps - Forward Monster Walk  - 1 x daily - 2 x weekly - 4 sets - Standing Single Leg Hip Circles  - 1 x daily - 2 x weekly - 1 sets - 20 reps  ASSESSMENT:  CLINICAL IMPRESSION: She had her first aquatic PT session with good overall tolerance noted. I did print out aquatic HEP for her and she shows good understanding of these.    OBJECTIVE IMPAIRMENTS: decreased activity tolerance, difficulty walking, decreased balance, decreased mobility, decreased ROM, decreased strength, impaired flexibility, impaired LE use, and pain.  ACTIVITY LIMITATIONS: bending, lifting, carry, locomotion, cleaning, community activity,  PERSONAL  FACTORS: see above PMH are also affecting patient's functional outcome.  REHAB POTENTIAL: Good  CLINICAL DECISION MAKING: Stable/uncomplicated  EVALUATION COMPLEXITY: Low    GOALS: Short term PT Goals Target date: 12/23/2022   Pt will be I and compliant with HEP. Baseline:  Goal status: New  Long term PT goals Target date:02/03/2023   Pt will improve Lt hip ER ROM to WNL to improve functional mobility Baseline: Goal status: New Pt will improve  hip/knee strength to at least 4+/5 MMT to improve functional strength Baseline: Goal status: New Pt will maintain 52% predicted functional score to Baseline:54% Goal status: New  PLAN: PT FREQUENCY: 1 times per 1-2 weeks  PT DURATION: 6-10 weeks  PLANNED INTERVENTIONS (unless contraindicated): aquatic PT, Canalith repositioning, cryotherapy, Electrical stimulation, Iontophoresis with 4 mg/ml dexamethasome, Moist heat, traction, Ultrasound, gait training, Therapeutic exercise, balance training, neuromuscular re-education, patient/family education, prosthetic training, manual techniques, passive ROM, dry needling, taping, vasopnuematic device, vestibular, spinal manipulations, joint manipulations 97110-Therapeutic exercises, 97530- Therapeutic activity, 97112- Neuromuscular re-education, 97535- Self Care, and 30865- Manual therapy  PLAN FOR NEXT SESSION:  review and progress HEP PRN focusing on hip and quad strength, improving sit to stand ability.  Ivery Quale, PT, DPT 12/11/22 9:40 AM  Referring diagnosis?  H84.696 (ICD-10-CM) - Status post total replacement of left hip  M62.81 (ICD-10-CM) - Quadriceps weakness  Z74.09 (ICD-10-CM) - Impaired functional mobility, balance, gait, and endurance   Treatment diagnosis? (if different than referring diagnosis)  What was this (referring dx) caused by? [x]  Surgery []  Fall []  Ongoing issue []  Arthritis []  Other: ____________  Laterality: []  Rt [x]  Lt []  Both  Check all possible  CPT codes:97110-Therapeutic exercises, 97530- Therapeutic activity, [id number]- Neuromuscular re-education, 97535- Self Care, and 29528- Manual therapy, 97113 aquatic PT *CHOOSE 10 OR LESS*

## 2022-12-15 ENCOUNTER — Encounter: Payer: Medicare PPO | Admitting: Physical Therapy

## 2022-12-31 ENCOUNTER — Ambulatory Visit (INDEPENDENT_AMBULATORY_CARE_PROVIDER_SITE_OTHER): Payer: Medicare PPO | Admitting: Physical Therapy

## 2022-12-31 ENCOUNTER — Encounter: Payer: Self-pay | Admitting: Physical Therapy

## 2022-12-31 DIAGNOSIS — M25552 Pain in left hip: Secondary | ICD-10-CM

## 2022-12-31 DIAGNOSIS — R2681 Unsteadiness on feet: Secondary | ICD-10-CM | POA: Diagnosis not present

## 2022-12-31 DIAGNOSIS — R2689 Other abnormalities of gait and mobility: Secondary | ICD-10-CM | POA: Diagnosis not present

## 2022-12-31 DIAGNOSIS — M6281 Muscle weakness (generalized): Secondary | ICD-10-CM

## 2022-12-31 NOTE — Therapy (Signed)
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   TOTAL ABDOMINAL HYSTERECTOMY  01/20/1979   bilat oophorectomy   TOTAL HIP ARTHROPLASTY Left 05/08/2022   Procedure: TOTAL HIP ARTHROPLASTY ANTERIOR APPROACH;  Surgeon: Kathryne Hitch, MD;  Location: [facility];  Service: Orthopedics;  Laterality: Left;   Patient Active Problem List   Diagnosis Date Noted   Closed subcapital fracture of left femur (HCC) 05/08/2022   Closed left femoral fracture (HCC) 05/07/2022   Age-related osteoporosis without current pathological fracture 10/25/2021   Primary osteoarthritis of first carpometacarpal joint of left hand 03/25/2016   GERD (gastroesophageal reflux disease) 05/10/2014   Hyperlipidemia 05/10/2014   Hypothyroid 05/10/2014   Hx of migraine headaches 05/10/2014    PCP: Gaspar Garbe, MD   REFERRING PROVIDER: Kirtland Bouchard, PA-C   REFERRING DIAG:  [phone] (ICD-10-CM) - Status post total replacement of left hip  M62.81 (ICD-10-CM) - Quadriceps weakness  Z74.09 (ICD-10-CM) - Impaired functional mobility, balance, gait, and endurance    THERAPY DIAG:  Pain in left  hip  Muscle weakness (generalized)  Other abnormalities of gait and mobility  Unsteadiness on feet  Rationale for Evaluation and Treatment: Rehabilitation  ONSET DATE: status post left total hip arthroplasty 05/08/2022.   SUBJECTIVE:   SUBJECTIVE STATEMENT: Relays she is not sure if she is doing some of the HEP correctly so has some questions and would like her form checked. 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 Yes. Number of falls 1 when she slipped on wet brick, denies any injury from this  PLOF: Independent  PATIENT GOALS: learning more challenging exercises  NEXT MD VISIT: 05/10/23  OBJECTIVE:  Note: Objective measures were completed at Evaluation unless otherwise noted.  DIAGNOSTIC FINDINGS: AP pelvis: Status post left total hip arthroplasty well-seated components.   No acute fractures no acute findings.  Right hip overall well-maintained.   PATIENT SURVEYS:  Eval FOTO 54% functional, predicted goal is to maintain 52%  COGNITION: Overall cognitive status: Within functional limits for tasks assessed  SENSATION: WFL   MUSCLE LENGTH: Mild tightness in  Lt Hamstrings, glutes, ITB   POSTURE: No Significant postural limitations   LOWER EXTREMITY ROM: WFL grossly but hip ER ROM limited by 25%   LOWER EXTREMITY MMT:  MMT Right eval Left eval  Hip flexion 4+ 4  Hip extension    Hip abduction 4+ 4  Hip adduction    Hip internal rotation 5 4  Hip external rotation 5 5  Knee flexion 5 5  Knee extension 4 5  Ankle dorsiflexion    Ankle plantarflexion    Ankle inversion     Ankle eversion     (Blank rows = not tested)  FUNCTIONAL TESTS:  SLS 20 seconds bilat   TODAY'S TREATMENT:  Date:12/31/22 Recumbent bike L3 X 8 min Piriformis/ITB stretching supine knee to opposite shoulder 30 sec X 3 bilat Piriformis/ITB stretching seated knee down,30 sec X 3 bilat Sit to stands X 10 no UE support, slow lower, Rt leg slightly further in front due to knee pain SLS 20 sec X 2 Lateral walks with mini squat green band 15 feet X 5 Monster walks with mini squat green band 15 feet X 5 HEP revision and review   DATE: 12/11/22 Pt seen for aquatic therapy today.  Treatment took place in water 3.5-4.75 ft in depth at the [facility] pool. Temp of water was 91.  Pt entered/exited the pool via stairs with hand rail.   Pt requires the buoyancy and hydrostatic pressure of water for support, and to offload joints by unweighting joint load by at least 50 % in navel deep water and by at least 75-80% in chest to neck deep water.  Viscosity of the water is needed for resistance of strengthening. Water current perturbations provides challenge to standing balance requiring increased core activation. Aquatic PT exercises Forward walking 3 round trips horizontal width of pool Backward walking 3 round trips Sidestepping 3 round trips Sidestepping with hop length of pool 3 round trips Front/back jogging 3 round trips in pool Tandem walk 3 round trips March walking 3 round trips, progressed to light jog Leg swings hip abd/add X 20 bilat with UE support with UE support Leg swings hip flexion/extension X 20 bilat Hops DL in/out X 20 Scissor hops (needed deeper water) X 20 bilat Hip circles CW and CCW X 15 each bilat Lunge step to pool wall, touch wall, then step back, X 15 bilat Monster walk hops 3 round trips Lunge stretch at pool 5 sec X 10  Squats at first step of pool holding onto rails X 20 Seated on pool bench cycling, and leg swings X 5 min   PATIENT  EDUCATION: Education details: HEP, PT plan of care Person educated: Patient Education method: Explanation, Demonstration, Verbal cues, and Handouts Education comprehension: verbalized understanding and needs further education   HOME EXERCISE PROGRAM: Access Code: [email or web address] URL: [email or web address] Date: 12/31/2022 Prepared by: Ivery Quale  Exercises - supine IT band and piriformis stretch  - 2 x daily - 6 x weekly - 3 sets - 30 hold - Seated Piriformis Stretch with Trunk Bend  - 1 x daily - 6 x weekly - 1 sets - 3 reps - 30 hold - Hip Hiking on Step (Mirrored)  - 1 x daily - 6 x weekly - 2 sets - 10 reps - Single Leg Stance  - 1 x daily - 6 x weekly - 1 sets - 3 reps - 20 sec hold - Sit to Stand Without  Arm Support  - 1 x daily - 6 x weekly - 3 sets - 5 reps - slow lower hold - Sidestepping in Squat with Resistance and Arms Forward  - 1 x daily - 6 x weekly - 2-3 sets - 10 reps - Band Walks  - 1 x daily - 6 x weekly - 2-3 sets - 10 reps  Aquatic HEP Access Code: [email or web address] URL: [email or web address] Date: 12/11/2022 Prepared by: Ivery Quale  Exercises - Forward Walking  - 1 x daily - 2 x weekly - 4 sets - Side Stepping  - 2 x daily - 2 x weekly - 4 sets - 10 reps - Standing Hip Flexion Extension at [employer]  - 1 x daily - 2 x weekly - 15 reps - Standing Hip Abduction Adduction at Pool Wall  - 1 x daily - 2 x weekly - 20 reps - Squat  - 1 x daily - 2 x weekly - 15 reps - Lunge to Target at [employer]  - 1 x daily - 2 x weekly - 1 sets - 20 reps - Alternating Arms Jumping in Place  - 1 x daily - 2 x weekly - 2 sets - 10 reps - lateral hops in water depth you prefer  - 2 x daily - 6 x weekly - 3 sets - 10 reps - Forward Monster Walk  - 1 x daily - 2 x weekly - 4 sets - Standing Single Leg Hip Circles  - 1 x daily - 2 x weekly - 1 sets - 20 reps  ASSESSMENT:  CLINICAL IMPRESSION: Session focused on HEP review with some form corrections and more  instructions provided to make them more clear. She shows good overall understanding of them and did not have any further questions at this time, she was encouraged to give our office a call if she has any more questions. One more visit scheduled at this time.  OBJECTIVE IMPAIRMENTS: decreased activity tolerance, difficulty walking, decreased balance, decreased mobility, decreased ROM, decreased strength, impaired flexibility, impaired LE use, and pain.  ACTIVITY LIMITATIONS: bending, lifting, carry, locomotion, cleaning, community activity,  PERSONAL FACTORS: see above PMH are also affecting patient's functional outcome.  REHAB POTENTIAL: Good  CLINICAL DECISION MAKING: Stable/uncomplicated  EVALUATION COMPLEXITY: Low    GOALS: Short term PT Goals Target date: 12/23/2022   Pt will be I and compliant with HEP. Baseline:  Goal status: initially met but revised 12/31/22  Long term PT goals Target date:02/03/2023   Pt will improve Lt hip ER ROM to WNL to improve functional mobility Baseline: Goal status: MET 12/31/22 Pt will improve  hip/knee strength to at least 4+/5 MMT to improve functional strength Baseline: Goal status: ongoing will check next visit Pt will maintain 52% predicted functional score to Baseline:54% Goal status: ongoing will check next visit  PLAN: PT FREQUENCY: 1 times per 1-2 weeks  PT DURATION: 6-10 weeks  PLANNED INTERVENTIONS (unless contraindicated): aquatic PT, Canalith repositioning, cryotherapy, Electrical stimulation, Iontophoresis with 4 mg/ml dexamethasome, Moist heat, traction, Ultrasound, gait training, Therapeutic exercise, balance training, neuromuscular re-education, patient/family education, prosthetic training, manual techniques, passive ROM, dry needling, taping, vasopnuematic device, vestibular, spinal manipulations, joint manipulations 97110-Therapeutic exercises, 97530- Therapeutic activity, 97112- Neuromuscular re-education, 97535- Self  Care, and 54098- Manual therapy  PLAN FOR NEXT SESSION:  update measurements and goals, FOTO, determine if she is ready to transition to independent program.  Ivery Quale, PT, DPT 12/31/22 3:25 PM  Referring diagnosis?  J19.147 (ICD-10-CM) - Status  post total replacement of left hip  M62.81 (ICD-10-CM) - Quadriceps weakness  Z74.09 (ICD-10-CM) - Impaired functional mobility, balance, gait, and endurance   Treatment diagnosis? (if different than referring diagnosis)  What was this (referring dx) caused by? [x]  Surgery []  Fall []  Ongoing issue []  Arthritis []  Other: ____________  Laterality: []  Rt [x]  Lt []  Both  Check all possible CPT codes:97110-Therapeutic exercises, 97530- Therapeutic activity, [id number]- Neuromuscular re-education, 97535- Self Care, and 62263- Manual therapy, 97113 aquatic PT *CHOOSE 10 OR LESS*

## 2023-01-04 ENCOUNTER — Encounter: Payer: Medicare PPO | Admitting: Physical Therapy

## 2023-01-07 ENCOUNTER — Encounter: Payer: Medicare PPO | Admitting: Physical Therapy

## 2023-01-11 ENCOUNTER — Encounter: Payer: Self-pay | Admitting: Physical Therapy

## 2023-01-11 ENCOUNTER — Ambulatory Visit: Payer: Medicare PPO | Admitting: Physical Therapy

## 2023-01-11 DIAGNOSIS — M6281 Muscle weakness (generalized): Secondary | ICD-10-CM

## 2023-01-11 DIAGNOSIS — M25552 Pain in left hip: Secondary | ICD-10-CM | POA: Diagnosis not present

## 2023-01-11 DIAGNOSIS — R2681 Unsteadiness on feet: Secondary | ICD-10-CM | POA: Diagnosis not present

## 2023-01-11 DIAGNOSIS — R2689 Other abnormalities of gait and mobility: Secondary | ICD-10-CM | POA: Diagnosis not present

## 2023-01-11 NOTE — Therapy (Signed)
OUTPATIENT PHYSICAL THERAPY TREATMENT/Discharge PHYSICAL THERAPY DISCHARGE SUMMARY  Visits from Start of Care: 4  Current functional level related to goals / functional outcomes: See below   Remaining deficits: See below   Education / Equipment: HEP  Plan:  Patient goals were  met so being discharged and she agrees to this plan.     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   TOTAL ABDOMINAL HYSTERECTOMY  01/20/1979   bilat oophorectomy   TOTAL HIP ARTHROPLASTY Left 05/08/2022   Procedure: TOTAL HIP ARTHROPLASTY ANTERIOR APPROACH;  Surgeon: Kathryne Hitch, MD;  Location: [facility];  Service: Orthopedics;  Laterality: Left;   Patient Active Problem List   Diagnosis Date Noted   Closed subcapital fracture of left femur (HCC) 05/08/2022   Closed left femoral fracture (HCC) 05/07/2022   Age-related osteoporosis without current pathological fracture 10/25/2021   Primary osteoarthritis of first carpometacarpal joint of left hand 03/25/2016   GERD (gastroesophageal reflux disease) 05/10/2014   Hyperlipidemia 05/10/2014   Hypothyroid 05/10/2014   Hx of migraine headaches 05/10/2014    PCP:  Gaspar Garbe, MD   REFERRING PROVIDER: Kathryne Hitch*   REFERRING DIAG:  O96.295 (ICD-10-CM) - Status post total replacement of left hip  M62.81 (ICD-10-CM) - Quadriceps weakness  Z74.09 (ICD-10-CM) - Impaired functional mobility, balance, gait, and endurance    THERAPY DIAG:  Pain in left hip  Muscle weakness (generalized)  Other abnormalities of gait and mobility  Unsteadiness on feet  Rationale for Evaluation and Treatment: Rehabilitation  ONSET DATE: status post left total hip arthroplasty 05/08/2022.   SUBJECTIVE:   SUBJECTIVE STATEMENT: Denies pain today, feels ready to DC today  PERTINENT HISTORY: Status post left total hip arthroplasty 05/08/2022, scoliosis  PAIN:  NPRS scale: 0 in hip, some pain in Rt knee at times Pain location:left lateral hip Pain description: constant Aggravating factors: flexing her hip some Relieving factors: tyelenol  PRECAUTIONS: None  RED FLAGS: None   WEIGHT BEARING RESTRICTIONS: No  FALLS:  Has patient fallen in last 6 months? Yes. Number of falls 1 when she slipped on wet brick, denies any injury from this  PLOF: Independent  PATIENT GOALS: learning more challenging exercises  NEXT MD VISIT: 05/10/23  OBJECTIVE:  Note: Objective measures were completed at Evaluation unless otherwise noted.  DIAGNOSTIC FINDINGS: AP pelvis: Status post left total hip arthroplasty well-seated components.   No acute fractures no acute findings.  Right hip overall well-maintained.   PATIENT SURVEYS:  Eval FOTO 54% functional, predicted goal is to maintain 52% 01/11/23: FOTO improved to 75% and met goal  COGNITION: Overall cognitive status: Within functional limits for tasks assessed     SENSATION: WFL   MUSCLE LENGTH: Mild tightness in  Lt Hamstrings, glutes, ITB   POSTURE: No Significant postural limitations   LOWER EXTREMITY ROM: WFL grossly but hip ER ROM limited by 25%   LOWER EXTREMITY MMT:  MMT  Right eval Left eval Right 01/11/23 Left 01/11/23  Hip flexion 4+ 4 5 5   Hip extension      Hip abduction 4+ 4 4+ 4+  Hip adduction      Hip internal rotation 5 4 5 5   Hip external rotation 5 5 5 5   Knee flexion 5 5 5 5   Knee extension 4 5 5 5   Ankle dorsiflexion      Ankle plantarflexion      Ankle inversion      Ankle eversion       (Blank rows = not tested)  FUNCTIONAL TESTS:  SLS 20 seconds bilat   TODAY'S TREATMENT:  Date:01/11/23 Nu step L5 X 8 min Piriformis/ITB stretching supine knee to opposite shoulder 30 sec X 2 bilat Piriformis/ITB stretching seated knee down,30 sec X 2 bilat Sit to stands X 10 no UE support, slow lower, Rt leg slightly further in front due to knee pain, holding onto 5#  SLS 20 sec X 2, then progressed to head turns X 10, head nods X 10, hip circles X10 Lateral walks with mini squat green band 15 feet X 5 Monster walks with mini squat green band 15 feet X 5 HEP revision and review    PATIENT EDUCATION: Education details: HEP, PT plan of care Person educated: Patient Education method: Explanation, Demonstration, Verbal cues, and Handouts Education comprehension: verbalized understanding and needs further education   HOME EXERCISE PROGRAM: Access Code: [email or web address] URL: [email or web address] Date: 12/31/2022 Prepared by: Ivery Quale  Exercises - supine IT band and piriformis stretch  - 2 x daily - 6 x weekly - 3 sets - 30 hold - Seated Piriformis Stretch with Trunk Bend  - 1 x daily - 6 x weekly - 1 sets - 3 reps - 30 hold - Hip Hiking on Step (Mirrored)  - 1 x daily - 6 x weekly - 2 sets - 10 reps - Single Leg Stance  - 1 x daily - 6 x weekly - 1 sets - 3 reps - 20 sec hold - Sit to Stand Without Arm Support  - 1 x daily - 6 x weekly - 3 sets - 5 reps - slow lower hold - Sidestepping in Squat with Resistance and Arms Forward  - 1 x daily - 6 x weekly - 2-3 sets - 10 reps - Band Walks  - 1 x daily - 6 x weekly - 2-3 sets -  10 reps  Aquatic HEP Access Code: [email or web address] URL: [email or web address] Date: 12/11/2022 Prepared by: Ivery Quale  Exercises - Forward Walking  - 1 x daily - 2 x weekly - 4 sets - Side Stepping  - 2 x daily - 2 x weekly - 4 sets - 10 reps - Standing Hip Flexion Extension at [employer]  - 1 x daily - 2 x weekly - 15 reps - Standing Hip Abduction Adduction at Pool Wall  - 1 x daily - 2 x weekly - 20 reps - Squat  - 1 x  daily - 2 x weekly - 15 reps - Lunge to Target at [employer]  - 1 x daily - 2 x weekly - 1 sets - 20 reps - Alternating Arms Jumping in Place  - 1 x daily - 2 x weekly - 2 sets - 10 reps - lateral hops in water depth you prefer  - 2 x daily - 6 x weekly - 3 sets - 10 reps - Forward Monster Walk  - 1 x daily - 2 x weekly - 4 sets - Standing Single Leg Hip Circles  - 1 x daily - 2 x weekly - 1 sets - 20 reps  ASSESSMENT:  CLINICAL IMPRESSION: She has done excellent with PT and has now met all PT goals so will be discharged to independent program.   OBJECTIVE IMPAIRMENTS: decreased activity tolerance, difficulty walking, decreased balance, decreased mobility, decreased ROM, decreased strength, impaired flexibility, impaired LE use, and pain.  ACTIVITY LIMITATIONS: bending, lifting, carry, locomotion, cleaning, community activity,  PERSONAL FACTORS: see above PMH are also affecting patient's functional outcome.  REHAB POTENTIAL: Good  CLINICAL DECISION MAKING: Stable/uncomplicated  EVALUATION COMPLEXITY: Low    GOALS: Short term PT Goals Target date: 12/23/2022   Pt will be I and compliant with HEP. Baseline:  Goal status: MET 01/11/23  Long term PT goals Target date:02/03/2023   Pt will improve Lt hip ER ROM to WNL to improve functional mobility Baseline: Goal status: MET 12/31/22 Pt will improve  hip/knee strength to at least 4+/5 MMT to improve functional strength Baseline: Goal status: MET 01/11/23 Pt will maintain 52% predicted functional  score to Baseline:54% Goal status: MET 01/11/23  PLAN: PT FREQUENCY: 1 times per 1-2 weeks  PT DURATION: 6-10 weeks  PLANNED INTERVENTIONS (unless contraindicated): aquatic PT, Canalith repositioning, cryotherapy, Electrical stimulation, Iontophoresis with 4 mg/ml dexamethasome, Moist heat, traction, Ultrasound, gait training, Therapeutic exercise, balance training, neuromuscular re-education, patient/family education, prosthetic training, manual techniques, passive ROM, dry needling, taping, vasopnuematic device, vestibular, spinal manipulations, joint manipulations 97110-Therapeutic exercises, 97530- Therapeutic activity, [id number]- Neuromuscular re-education, 97535- Self Care, and 19147- Manual therapy  PLAN FOR NEXT SESSION:  DC today Ivery Quale, PT, DPT 01/11/23 10:11 AM  Referring diagnosis?  W29.562 (ICD-10-CM) - Status post total replacement of left hip  M62.81 (ICD-10-CM) - Quadriceps weakness  Z74.09 (ICD-10-CM) - Impaired functional mobility, balance, gait, and endurance   Treatment diagnosis? (if different than referring diagnosis)  What was this (referring dx) caused by? [x]  Surgery []  Fall []  Ongoing issue []  Arthritis []  Other: ____________  Laterality: []  Rt [x]  Lt []  Both  Check all possible CPT codes:97110-Therapeutic exercises, 97530- Therapeutic activity, 97112- Neuromuscular re-education, 97535- Self Care, and 13086- Manual therapy, 97113 aquatic PT *CHOOSE 10 OR LESS*

## 2023-01-15 DIAGNOSIS — Z961 Presence of intraocular lens: Secondary | ICD-10-CM | POA: Diagnosis not present

## 2023-01-15 DIAGNOSIS — H1789 Other corneal scars and opacities: Secondary | ICD-10-CM | POA: Diagnosis not present

## 2023-01-15 DIAGNOSIS — H524 Presbyopia: Secondary | ICD-10-CM | POA: Diagnosis not present

## 2023-04-08 ENCOUNTER — Ambulatory Visit: Admitting: Orthopaedic Surgery

## 2023-04-08 ENCOUNTER — Encounter: Payer: Self-pay | Admitting: Orthopaedic Surgery

## 2023-04-08 ENCOUNTER — Other Ambulatory Visit (INDEPENDENT_AMBULATORY_CARE_PROVIDER_SITE_OTHER): Payer: Self-pay

## 2023-04-08 DIAGNOSIS — Z96642 Presence of left artificial hip joint: Secondary | ICD-10-CM

## 2023-04-08 DIAGNOSIS — M7062 Trochanteric bursitis, left hip: Secondary | ICD-10-CM

## 2023-04-08 MED ORDER — METHYLPREDNISOLONE ACETATE 40 MG/ML IJ SUSP
40.0000 mg | INTRAMUSCULAR | Status: AC | PRN
  Administered 2023-04-08: 40 mg via INTRA_ARTICULAR

## 2023-04-08 MED ORDER — LIDOCAINE HCL 1 % IJ SOLN
3.0000 mL | INTRAMUSCULAR | Status: AC | PRN
Start: 2023-04-08 — End: 2023-04-08
  Administered 2023-04-08: 3 mL

## 2023-04-08 NOTE — Progress Notes (Signed)
 The patient is an active 83 year old female who comes in today with left hip pain for about 10 days now.  We actually replaced her left hip back in April 2024 secondary to a femoral neck fracture.  She is very active.  She points to the lateral aspect of her hip as a source of her pain but she has had some pain over her iliac crest as well.  She denies any fever and chills or any recent illnesses or injuries.  She is very active though.  On examination her left hip moves smoothly and fluidly.  Her incisions healed nicely.  There is pain to palpation of the trochanteric of the hip and some over the oblique muscles in the upper pelvis iliac crest area.  There is no worrisome findings on exam.  X-rays show well-seated left total hip arthroplasty.  There are no cortical irregularities around her pelvis or her hip area.  I did recommend a steroid injection over left hip trochanteric area which she agreed to and tolerated well.  She will hold off on exercises for the next 2 weeks to allow this to calm down but I am fine with her exercise walking.  This should do well with time.  If she does have any issues can come back and see [place].  Otherwise follow-up is as needed.    Procedure Note  Patient: Cheryl Bullock             Date of Birth: 09-30-1940           MRN: [id number]             Visit Date: 04/08/2023  Procedures: Visit Diagnoses:  1. History of left hip replacement   2. Trochanteric bursitis, left hip     Large Joint Inj: L greater trochanter on 04/08/2023 3:07 PM Indications: pain and diagnostic evaluation Details: 22 G 1.5 in needle, lateral approach  Arthrogram: No  Medications: 3 mL lidocaine 1 %; 40 mg methylPREDNISolone acetate 40 MG/ML Outcome: tolerated well, no immediate complications Procedure, treatment alternatives, risks and benefits explained, specific risks discussed. Consent was given by the patient. Immediately prior to procedure a time out was called to verify the correct  patient, procedure, equipment, support staff and site/side marked as required. Patient was prepped and draped in the usual sterile fashion.

## 2023-04-12 NOTE — Progress Notes (Signed)
 83 y.o. G0P0000 Widowed Caucasian female here for a breast and pelvic exam.    The patient is also followed for osteoporosis. Had a hip replacement.  Is on Fosamax through Dr. Valarie Cones following a fracture of her femur.  Due for a bone density in 2025.   She and her brother went to [facility] many years ago and had evaluation for potential future pancreatic cancer, and she was told she was not at risk.   PCP: Gaspar Garbe, MD   No LMP recorded. Patient has had a hysterectomy.           Sexually active: No.  The current method of family planning is status post hysterectomy.    Menopausal hormone therapy:  n/a Exercising: Yes.     Silver sneaker 2x a week, water aerobics 3x a week, walking everyday Smoker:  no  OB History     Gravida  0   Para  0   Term  0   Preterm  0   AB  0   Living  0      SAB  0   IAB  0   Ectopic  0   Multiple  0   Live Births              HEALTH MAINTENANCE: Last 2 paps: 2000 neg History of abnormal Pap or positive HPV:  no Mammogram:  09/29/22 Breast density Cat B, BI-RADS CAT 1 neg Colonoscopy:  2009, no longer doing Bone Density:  09/26/21  Result  osteoporosis   Immunization History  Administered Date(s) Administered   PFIZER(Purple Top)SARS-COV-2 Vaccination 02/24/2019, 03/21/2019      reports that she has never smoked. She has never used smokeless tobacco. She reports current alcohol use of about 1.0 standard drink of alcohol per week. She reports that she does not use drugs.  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    TOTAL ABDOMINAL HYSTERECTOMY  01/20/1979   bilat oophorectomy   TOTAL HIP ARTHROPLASTY Left 05/08/2022   Procedure: TOTAL HIP ARTHROPLASTY ANTERIOR APPROACH;  Surgeon: Kathryne Hitch, MD;  Location: [facility];  Service: Orthopedics;  Laterality: Left;    Current Outpatient Medications  Medication Sig Dispense Refill   acetaminophen (TYLENOL) 500 MG tablet Take 500 mg by mouth daily.     alendronate (FOSAMAX) 70 MG tablet Take 70 mg by mouth once a week.     aspirin 81 MG chewable tablet Chew 1 tablet (81 mg total) by mouth 2 (two) times daily. 30 tablet 0   Calcium Carb-Cholecalciferol 600-500 MG-UNIT CAPS Take 1 tablet by mouth daily.     Calcium Carbonate-Vitamin D 600-5 MG-MCG CAPS Take 1 capsule by mouth daily.     Cholecalciferol (VITAMIN D3) 2000 UNITS capsule Take 2,000 Units by mouth daily.     glucosamine-chondroitin 500-400 MG tablet Take 1 tablet by mouth 2 (two) times daily.     levothyroxine (SYNTHROID, LEVOTHROID) 75 MCG tablet Take 75  mcg by mouth daily.     Multiple Vitamin (MULTIVITAMIN) capsule Take 1 capsule by mouth daily.     rizatriptan (MAXALT) 10 MG tablet Take 10 mg by mouth as needed for migraine.     simvastatin (ZOCOR) 5 MG tablet Take 5 mg by mouth daily.     No current facility-administered medications for this visit.    ALLERGIES: Clarithromycin  Family History  Problem Relation Age of Onset   Cancer Mother        pancreatic cancer   Osteoporosis Mother    Dementia Father    Cancer Maternal Aunt        pancreatic cancer   Breast cancer Neg Hx     Review of Systems  All other systems reviewed and are negative.   PHYSICAL EXAM:  BP 116/74 (BP Location: Left Arm, Patient Position: Sitting, Cuff Size: Small)   Pulse 62   Ht 5' 6.5" (1.689 m)   Wt 118 lb (53.5 kg)   SpO2 98%   BMI 18.76 kg/m     General appearance: alert, cooperative and appears stated age Head: normocephalic, without obvious abnormality, atraumatic Neck: no  adenopathy, supple, symmetrical, trachea midline and thyroid normal to inspection and palpation Lungs: clear to auscultation bilaterally Breasts: normal appearance, no masses or tenderness, No nipple retraction or dimpling, No nipple discharge or bleeding, No axillary adenopathy Heart: regular rate and rhythm Abdomen: soft, non-tender; no masses, no organomegaly Extremities: extremities normal, atraumatic, no cyanosis or edema Skin: skin color, texture, turgor normal. No rashes or lesions Lymph nodes: cervical, supraclavicular, and axillary nodes normal. Neurologic: grossly normal  Pelvic: External genitalia:  no lesions              No abnormal inguinal nodes palpated.              Urethra:  normal appearing urethra with no masses, tenderness or lesions              Bartholins and Skenes: normal                 Vagina: normal appearing vagina with normal color and discharge, no lesions              Cervix:  absent              Pap taken: No. Bimanual Exam:  Uterus:  absent              Adnexa: no mass, fullness, tenderness              Rectal exam: Yes.  .  Confirms.              Anus:  normal sphincter tone, no lesions  Chaperone was present for exam:  Warren Lacy, CMA  ASSESSMENT: Encounter for breast and pelvic exam.  Status post TAH/BSO.  Hx left femur fracture.  Osteoporosis.  On Fosamax through Dr. Valarie Cones.  FH pancreatic cancer in mother and maternal aunt.  Patient has been evaluated and told she is not at risk.  [facility].   PLAN: Mammogram screening discussed. Self breast awareness reviewed. Pap and HRV collected:  no.  Not indicated.  Guidelines for Calcium, Vitamin D, regular exercise program including cardiovascular and weight bearing exercise. Medication refills:  NA BMD [facility].  Patent will continue Fosamax through Dr. Valarie Cones.  Follow up:  2 years and prn.    Additional counseling given.  yes. 20 min  total time  was spent for this  patient encounter, including preparation, face-to-face counseling with the patient, coordination of care, and documentation of the encounter in addition to doing the breast and pelvic exam.  We discussed her osteoporosis monitoring and care.

## 2023-04-26 ENCOUNTER — Ambulatory Visit (INDEPENDENT_AMBULATORY_CARE_PROVIDER_SITE_OTHER): Payer: Medicare PPO | Admitting: Obstetrics and Gynecology

## 2023-04-26 ENCOUNTER — Encounter: Payer: Self-pay | Admitting: Obstetrics and Gynecology

## 2023-04-26 VITALS — BP 116/74 | HR 62 | Ht 66.5 in | Wt 118.0 lb

## 2023-04-26 DIAGNOSIS — Z01419 Encounter for gynecological examination (general) (routine) without abnormal findings: Secondary | ICD-10-CM | POA: Diagnosis not present

## 2023-04-26 DIAGNOSIS — M810 Age-related osteoporosis without current pathological fracture: Secondary | ICD-10-CM | POA: Diagnosis not present

## 2023-04-26 NOTE — Patient Instructions (Signed)

## 2023-04-27 ENCOUNTER — Other Ambulatory Visit: Payer: Self-pay | Admitting: Obstetrics and Gynecology

## 2023-04-27 DIAGNOSIS — M810 Age-related osteoporosis without current pathological fracture: Secondary | ICD-10-CM

## 2023-04-27 DIAGNOSIS — Z1231 Encounter for screening mammogram for malignant neoplasm of breast: Secondary | ICD-10-CM

## 2023-05-10 ENCOUNTER — Ambulatory Visit: Payer: Medicare PPO | Admitting: Orthopaedic Surgery

## 2023-07-09 DIAGNOSIS — L237 Allergic contact dermatitis due to plants, except food: Secondary | ICD-10-CM | POA: Diagnosis not present

## 2023-10-26 DIAGNOSIS — H903 Sensorineural hearing loss, bilateral: Secondary | ICD-10-CM | POA: Diagnosis not present

## 2023-10-29 DIAGNOSIS — E039 Hypothyroidism, unspecified: Secondary | ICD-10-CM | POA: Diagnosis not present

## 2023-10-29 DIAGNOSIS — E7800 Pure hypercholesterolemia, unspecified: Secondary | ICD-10-CM | POA: Diagnosis not present

## 2023-10-29 DIAGNOSIS — M8580 Other specified disorders of bone density and structure, unspecified site: Secondary | ICD-10-CM | POA: Diagnosis not present

## 2023-11-01 ENCOUNTER — Ambulatory Visit (HOSPITAL_BASED_OUTPATIENT_CLINIC_OR_DEPARTMENT_OTHER)
Admission: RE | Admit: 2023-11-01 | Discharge: 2023-11-01 | Disposition: A | Discharge status: Home / Self Care | Source: Ambulatory Visit | Attending: Obstetrics and Gynecology | Admitting: Obstetrics and Gynecology

## 2023-11-01 DIAGNOSIS — M810 Age-related osteoporosis without current pathological fracture: Secondary | ICD-10-CM | POA: Insufficient documentation

## 2023-11-01 DIAGNOSIS — Z780 Asymptomatic menopausal state: Secondary | ICD-10-CM | POA: Diagnosis not present

## 2023-11-05 ENCOUNTER — Ambulatory Visit: Payer: Self-pay | Admitting: Obstetrics and Gynecology

## 2023-11-09 DIAGNOSIS — R82998 Other abnormal findings in urine: Secondary | ICD-10-CM | POA: Diagnosis not present

## 2023-11-09 DIAGNOSIS — M8580 Other specified disorders of bone density and structure, unspecified site: Secondary | ICD-10-CM | POA: Diagnosis not present

## 2023-11-09 DIAGNOSIS — Z0000 Encounter for general adult medical examination without abnormal findings: Secondary | ICD-10-CM | POA: Diagnosis not present

## 2023-11-09 DIAGNOSIS — Z1331 Encounter for screening for depression: Secondary | ICD-10-CM | POA: Diagnosis not present

## 2023-11-09 DIAGNOSIS — E039 Hypothyroidism, unspecified: Secondary | ICD-10-CM | POA: Diagnosis not present

## 2023-11-09 DIAGNOSIS — E7800 Pure hypercholesterolemia, unspecified: Secondary | ICD-10-CM | POA: Diagnosis not present

## 2023-11-09 DIAGNOSIS — G43009 Migraine without aura, not intractable, without status migrainosus: Secondary | ICD-10-CM | POA: Diagnosis not present

## 2023-11-09 DIAGNOSIS — Z1339 Encounter for screening examination for other mental health and behavioral disorders: Secondary | ICD-10-CM | POA: Diagnosis not present

## 2023-11-09 DIAGNOSIS — H9190 Unspecified hearing loss, unspecified ear: Secondary | ICD-10-CM | POA: Diagnosis not present

## 2023-11-22 ENCOUNTER — Encounter: Payer: Self-pay | Admitting: Radiology

## 2023-12-28 ENCOUNTER — Other Ambulatory Visit

## 2023-12-28 ENCOUNTER — Inpatient Hospital Stay
Admission: RE | Admit: 2023-12-28 | Discharge: 2023-12-28 | Attending: Obstetrics and Gynecology | Admitting: Obstetrics and Gynecology

## 2023-12-28 DIAGNOSIS — Z1231 Encounter for screening mammogram for malignant neoplasm of breast: Secondary | ICD-10-CM

## 2024-01-02 ENCOUNTER — Ambulatory Visit: Payer: Self-pay | Admitting: Obstetrics and Gynecology
